# Patient Record
Sex: Male | Born: 1955
Health system: Southern US, Community
[De-identification: ages and names within clinical notes are randomized; demographics above are authoritative.]

## PROBLEM LIST (undated history)

## (undated) DIAGNOSIS — I639 Cerebral infarction, unspecified: Secondary | ICD-10-CM

## (undated) DIAGNOSIS — E785 Hyperlipidemia, unspecified: Secondary | ICD-10-CM

## (undated) HISTORY — DX: Cerebral infarction, unspecified: I63.9

---

## 2017-07-22 ENCOUNTER — Encounter (HOSPITAL_COMMUNITY): Payer: Self-pay | Admitting: Radiology

## 2017-07-22 ENCOUNTER — Observation Stay (HOSPITAL_COMMUNITY): Payer: 59

## 2017-07-22 ENCOUNTER — Emergency Department (HOSPITAL_COMMUNITY): Payer: 59

## 2017-07-22 ENCOUNTER — Inpatient Hospital Stay (HOSPITAL_COMMUNITY)
Admission: EM | Admit: 2017-07-22 | Discharge: 2017-07-26 | DRG: 041 | Disposition: A | Discharge status: Home / Self Care | Payer: 59 | Attending: Family Medicine | Admitting: Family Medicine

## 2017-07-22 DIAGNOSIS — Q2112 Patent foramen ovale: Secondary | ICD-10-CM

## 2017-07-22 DIAGNOSIS — R1310 Dysphagia, unspecified: Secondary | ICD-10-CM | POA: Diagnosis present

## 2017-07-22 DIAGNOSIS — R2990 Unspecified symptoms and signs involving the nervous system: Secondary | ICD-10-CM | POA: Diagnosis present

## 2017-07-22 DIAGNOSIS — I6340 Cerebral infarction due to embolism of unspecified cerebral artery: Secondary | ICD-10-CM | POA: Insufficient documentation

## 2017-07-22 DIAGNOSIS — Z823 Family history of stroke: Secondary | ICD-10-CM

## 2017-07-22 DIAGNOSIS — Z888 Allergy status to other drugs, medicaments and biological substances status: Secondary | ICD-10-CM

## 2017-07-22 DIAGNOSIS — G8929 Other chronic pain: Secondary | ICD-10-CM | POA: Diagnosis present

## 2017-07-22 DIAGNOSIS — I63511 Cerebral infarction due to unspecified occlusion or stenosis of right middle cerebral artery: Secondary | ICD-10-CM | POA: Diagnosis not present

## 2017-07-22 DIAGNOSIS — R001 Bradycardia, unspecified: Secondary | ICD-10-CM | POA: Diagnosis present

## 2017-07-22 DIAGNOSIS — K116 Mucocele of salivary gland: Secondary | ICD-10-CM

## 2017-07-22 DIAGNOSIS — Z8249 Family history of ischemic heart disease and other diseases of the circulatory system: Secondary | ICD-10-CM

## 2017-07-22 DIAGNOSIS — I472 Ventricular tachycardia: Secondary | ICD-10-CM | POA: Diagnosis not present

## 2017-07-22 DIAGNOSIS — K118 Other diseases of salivary glands: Secondary | ICD-10-CM

## 2017-07-22 DIAGNOSIS — E785 Hyperlipidemia, unspecified: Secondary | ICD-10-CM | POA: Diagnosis not present

## 2017-07-22 DIAGNOSIS — R471 Dysarthria and anarthria: Secondary | ICD-10-CM | POA: Diagnosis present

## 2017-07-22 DIAGNOSIS — Q211 Atrial septal defect: Secondary | ICD-10-CM

## 2017-07-22 DIAGNOSIS — I10 Essential (primary) hypertension: Secondary | ICD-10-CM | POA: Diagnosis present

## 2017-07-22 DIAGNOSIS — E214 Other specified disorders of parathyroid gland: Secondary | ICD-10-CM | POA: Diagnosis present

## 2017-07-22 DIAGNOSIS — I639 Cerebral infarction, unspecified: Secondary | ICD-10-CM | POA: Diagnosis present

## 2017-07-22 DIAGNOSIS — M25561 Pain in right knee: Secondary | ICD-10-CM | POA: Diagnosis present

## 2017-07-22 DIAGNOSIS — M1990 Unspecified osteoarthritis, unspecified site: Secondary | ICD-10-CM | POA: Diagnosis present

## 2017-07-22 DIAGNOSIS — E860 Dehydration: Secondary | ICD-10-CM | POA: Diagnosis present

## 2017-07-22 DIAGNOSIS — R29810 Facial weakness: Secondary | ICD-10-CM | POA: Diagnosis present

## 2017-07-22 DIAGNOSIS — M1991 Primary osteoarthritis, unspecified site: Secondary | ICD-10-CM | POA: Diagnosis present

## 2017-07-22 HISTORY — DX: Hyperlipidemia, unspecified: E78.5

## 2017-07-22 LAB — CBC
HEMATOCRIT: 41.8 % (ref 39.0–52.0)
HEMOGLOBIN: 14.1 g/dL (ref 13.0–17.0)
MCH: 29 pg (ref 26.0–34.0)
MCHC: 33.7 g/dL (ref 30.0–36.0)
MCV: 85.8 fL (ref 78.0–100.0)
Platelets: 197 10*3/uL (ref 150–400)
RBC: 4.87 MIL/uL (ref 4.22–5.81)
RDW: 14.3 % (ref 11.5–15.5)
WBC: 5.9 10*3/uL (ref 4.0–10.5)

## 2017-07-22 LAB — URINALYSIS, ROUTINE W REFLEX MICROSCOPIC
Bilirubin Urine: NEGATIVE
GLUCOSE, UA: NEGATIVE mg/dL
Hgb urine dipstick: NEGATIVE
KETONES UR: NEGATIVE mg/dL
Leukocytes, UA: NEGATIVE
Nitrite: NEGATIVE
PROTEIN: NEGATIVE mg/dL
Specific Gravity, Urine: 1.017 (ref 1.005–1.030)
pH: 7 (ref 5.0–8.0)

## 2017-07-22 LAB — PROTIME-INR
INR: 0.97
Prothrombin Time: 12.9 seconds (ref 11.4–15.2)

## 2017-07-22 LAB — COMPREHENSIVE METABOLIC PANEL
ALBUMIN: 3.8 g/dL (ref 3.5–5.0)
ALT: 22 U/L (ref 17–63)
AST: 22 U/L (ref 15–41)
Alkaline Phosphatase: 78 U/L (ref 38–126)
Anion gap: 8 (ref 5–15)
BUN: 19 mg/dL (ref 6–20)
CHLORIDE: 105 mmol/L (ref 101–111)
CO2: 26 mmol/L (ref 22–32)
CREATININE: 1.13 mg/dL (ref 0.61–1.24)
Calcium: 9.1 mg/dL (ref 8.9–10.3)
GFR calc Af Amer: >60 mL/min (ref >60)
GFR calc non Af Amer: >60 mL/min (ref >60)
GLUCOSE: 95 mg/dL (ref 65–99)
POTASSIUM: 4 mmol/L (ref 3.5–5.1)
Sodium: 139 mmol/L (ref 135–145)
Total Bilirubin: 1.1 mg/dL (ref 0.3–1.2)
Total Protein: 6.5 g/dL (ref 6.5–8.1)

## 2017-07-22 LAB — I-STAT CHEM 8, ED
BUN: 27 mg/dL — ABNORMAL HIGH (ref 6–20)
CHLORIDE: 104 mmol/L (ref 101–111)
CREATININE: 1 mg/dL (ref 0.61–1.24)
Calcium, Ion: 1.11 mmol/L — ABNORMAL LOW (ref 1.15–1.40)
GLUCOSE: 92 mg/dL (ref 65–99)
HEMATOCRIT: 44 % (ref 39.0–52.0)
HEMOGLOBIN: 15 g/dL (ref 13.0–17.0)
POTASSIUM: 4.3 mmol/L (ref 3.5–5.1)
Sodium: 139 mmol/L (ref 135–145)
TCO2: 28 mmol/L (ref 0–100)

## 2017-07-22 LAB — DIFFERENTIAL
BASOS ABS: 0 10*3/uL (ref 0.0–0.1)
BASOS PCT: 0 %
EOS ABS: 0.2 10*3/uL (ref 0.0–0.7)
Eosinophils Relative: 3 %
Lymphocytes Relative: 33 %
Lymphs Abs: 2 10*3/uL (ref 0.7–4.0)
MONOS PCT: 8 %
Monocytes Absolute: 0.4 10*3/uL (ref 0.1–1.0)
NEUTROS ABS: 3.3 10*3/uL (ref 1.7–7.7)
NEUTROS PCT: 56 %

## 2017-07-22 LAB — RAPID URINE DRUG SCREEN, HOSP PERFORMED
AMPHETAMINES: NOT DETECTED
BARBITURATES: NOT DETECTED
BENZODIAZEPINES: NOT DETECTED
COCAINE: NOT DETECTED
Opiates: NOT DETECTED
TETRAHYDROCANNABINOL: NOT DETECTED

## 2017-07-22 LAB — ETHANOL: Alcohol, Ethyl (B): <5 mg/dL (ref <5)

## 2017-07-22 LAB — I-STAT TROPONIN, ED: Troponin i, poc: 0 ng/mL (ref 0.00–0.08)

## 2017-07-22 LAB — CBG MONITORING, ED: Glucose-Capillary: 85 mg/dL (ref 65–99)

## 2017-07-22 LAB — APTT: APTT: 32 s (ref 24–36)

## 2017-07-22 MED ORDER — IOPAMIDOL (ISOVUE-370) INJECTION 76%
INTRAVENOUS | Status: AC
  Administered 2017-07-22: 50 mL
  Filled 2017-07-22: qty 50

## 2017-07-22 MED ORDER — CLOPIDOGREL BISULFATE 75 MG PO TABS
300.0000 mg | ORAL_TABLET | Freq: Once | ORAL | Status: AC
  Administered 2017-07-22: 300 mg via ORAL
  Filled 2017-07-22: qty 4

## 2017-07-22 MED ORDER — CLOPIDOGREL BISULFATE 75 MG PO TABS
75.0000 mg | ORAL_TABLET | Freq: Every day | ORAL | Status: DC
  Administered 2017-07-23 – 2017-07-26 (×4): 75 mg via ORAL
  Filled 2017-07-22 (×4): qty 1

## 2017-07-22 MED ORDER — LABETALOL HCL 5 MG/ML IV SOLN: 10.0000 mg | INTRAVENOUS | Status: DC | PRN

## 2017-07-22 MED ORDER — ACETAMINOPHEN 325 MG PO TABS: 650.0000 mg | ORAL_TABLET | ORAL | Status: DC | PRN

## 2017-07-22 MED ORDER — ACETAMINOPHEN 160 MG/5ML PO SOLN: 650.0000 mg | ORAL | Status: DC | PRN

## 2017-07-22 MED ORDER — ASPIRIN 300 MG RE SUPP: 300.0000 mg | Freq: Every day | RECTAL | Status: DC

## 2017-07-22 MED ORDER — SODIUM CHLORIDE 0.9 % IV SOLN: INTRAVENOUS | Status: DC

## 2017-07-22 MED ORDER — ENOXAPARIN SODIUM 40 MG/0.4ML ~~LOC~~ SOLN
40.0000 mg | SUBCUTANEOUS | Status: DC
  Administered 2017-07-22: 40 mg via SUBCUTANEOUS
  Filled 2017-07-22: qty 0.4

## 2017-07-22 MED ORDER — STROKE: EARLY STAGES OF RECOVERY BOOK
Freq: Once | Status: AC
  Administered 2017-07-22: 20:00:00

## 2017-07-22 MED ORDER — ACETAMINOPHEN 650 MG RE SUPP: 650.0000 mg | RECTAL | Status: DC | PRN

## 2017-07-22 NOTE — Evaluation (Signed)
Clinical/Bedside Swallow Evaluation Patient Details  Name: Luis [name] MRN: [id number] Date of Birth: [birth year]  Today's Date: 07/22/2017 Time: SLP Start Time (ACUTE ONLY): 1434 SLP Stop Time (ACUTE ONLY): 1500 SLP Time Calculation (min) (ACUTE ONLY): 26 min  Past Medical History: History reviewed. No pertinent past medical history. Past Surgical History: History reviewed. No pertinent surgical history. HPI:  Luis [name] an 61 y.o.malewithout any known medical conditions presented to [facility] with complaints of left facial droop, slurred speech and dysphagia. Mr. Gorr stated that he woke at 0600 and spoke to his mother without symptoms. He went back to bed and woke again at 0930 with the above symptoms. Head CT shows small ortical infarct in the posterior right frontal lobe.    Assessment / Plan / Recommendation Clinical Impression  Pt demonstrates mild dysphagia with mild left CN VII and CNXII weakness following small right frontal CVA. Pt reports coughing with liquids this am, He is very cautious and deliberate during assessment with no coughing or signs of aspiration with small sips. When pushed to take large consecutive swallows pt did have some immediate coughing and sesnation of probable aspiration. He tolerates soldis well. Recommend pt initiate a regular diet and thin liquids. Advised pt and family of signs of aspiration and to inform RN is coughing becomes more frequent and SLP can be contacted. Otherwise, SLP will f/u next week for tolerance.  SLP Visit Diagnosis: Dysphagia, oropharyngeal phase (R13.12)    Aspiration Risk  Mild aspiration risk    Diet Recommendation Regular;Thin liquid   Liquid Administration via: Cup;No straw Medication Administration: Whole meds with liquid or whole with puree if coughing with water and pills Supervision: Patient able to self feed Compensations: Slow rate;Small sips/bites Postural Changes: Seated upright at 90 degrees    Other   Recommendations Oral Care Recommendations: Oral care BID   Follow up Recommendations        Frequency and Duration min 2x/week  2 weeks       Prognosis Prognosis for Safe Diet Advancement: Good      Swallow Study   General HPI: Luis [name] an 61 y.o.malewithout any known medical conditions presented to [facility] with complaints of left facial droop, slurred speech and dysphagia. Mr. Meech stated that he woke at 0600 and spoke to his mother without symptoms. He went back to bed and woke again at 0930 with the above symptoms. Head CT shows small ortical infarct in the posterior right frontal lobe.  Type of Study: Bedside Swallow Evaluation Previous Swallow Assessment: none Diet Prior to this Study: NPO Temperature Spikes Noted: No Respiratory Status: Room air History of Recent Intubation: No Behavior/Cognition: Alert;Cooperative;Pleasant mood Oral Cavity Assessment: Within Functional Limits Oral Care Completed by SLP: No Oral Cavity - Dentition: Adequate natural dentition Vision: Functional for self-feeding Self-Feeding Abilities: Able to feed self Patient Positioning: Upright in bed Baseline Vocal Quality: Normal Volitional Cough: Strong Volitional Swallow: Able to elicit    Oral/Motor/Sensory Function Overall Oral Motor/Sensory Function: Mild impairment Facial ROM: Reduced left;Suspected CN VII (facial) dysfunction Facial Symmetry: Abnormal symmetry left;Suspected CN VII (facial) dysfunction Facial Strength: Reduced left;Suspected CN VII (facial) dysfunction Facial Sensation: Within Functional Limits Lingual ROM: Reduced left;Suspected CN XII (hypoglossal) dysfunction Lingual Symmetry: Abnormal symmetry left;Suspected CN XII (hypoglossal) dysfunction Lingual Strength: Reduced;Suspected CN XII (hypoglossal) dysfunction Lingual Sensation: Within Functional Limits Velum: Within Functional Limits Mandible: Within Functional Limits   Ice Chips     Thin Liquid Thin  Liquid: Impaired Presentation: Cup;Self Fed;Straw Pharyngeal  Phase  Impairments: Suspected delayed Swallow;Cough - Immediate    Nectar Thick Nectar Thick Liquid: Not tested   Honey Thick Honey Thick Liquid: Not tested   Puree Puree: Within functional limits Presentation: Self Fed;Spoon   Solid   GO   Solid: Within functional limits       [facility], MA CCC-SLP [phone]  Claudine Mouton 07/22/2017,3:09 PM

## 2017-07-22 NOTE — ED Notes (Signed)
This RN noted patient to be eating crackers and water when she went in to provide with a warm blanket.  Pt noted to have failed his swallow screen previously.  SLP has been ordered.  Will clarify with admitting doctor.

## 2017-07-22 NOTE — Code Documentation (Signed)
61yo male arriving to [facility] via [facility] at 1156.  Patient from home where he woke up at 0600.  He reports talking to her mother and going to the bathroom.  His speech was normal at that time, and he went back to bed.  He woke up at 0930 and family noted him to have slurred speech and facial droop.  Patient had difficulty eating his breakfast.  EMS called and activated a code stroke.  Stroke team at the bedside on patient arrival.  Labs drawn and patient to CT with stroke team.  CT and CTA head and neck completed.  NIHSS 3, see documentation for details and code stroke times.  Patient with left facial droop and mild dysarthria on exam.  Patient is outside the window for treatment with tPA.  No acute stroke treatment at this time.  Bedside handoff with ED RN Jesse Sans.

## 2017-07-22 NOTE — Evaluation (Signed)
Speech Language Pathology Evaluation Patient Details Name: Kimsey Velardo MRN: [id number] DOB: 23-Oct-1956 Today's Date: 07/22/2017 Time: 1430-1500 SLP Time Calculation (min) (ACUTE ONLY): 30 min  Problem List:  Patient Active Problem List   Diagnosis Date Noted  . Stroke-like symptoms 07/22/2017  . HLD (hyperlipidemia) 07/22/2017  . OA (osteoarthritis) 07/22/2017   Past Medical History: History reviewed. No pertinent past medical history. Past Surgical History: History reviewed. No pertinent surgical history. HPI:  Zaydenn Isgett an 61 y.o.malewithout any known medical conditions presented to [facility] with complaints of left facial droop, slurred speech and dysphagia. Mr. Beck stated that he woke at 0600 and spoke to his mother without symptoms. He went back to bed and woke again at 0930 with the above symptoms. Head CT shows small ortical infarct in the posterior right frontal lobe.    Assessment / Plan / Recommendation Clinical Impression  Pt demonstrates mild dysarthria without further cognitive deficits. Explained CN XII weakness and impact on speech. SLP provided modeling and demonstration for slow, loud, overarticulated speech to improve speech. Pt demonstrates while counting and over several trials in conversation. Will f/u to reinforce strategies, but pt will likely expericence spontaneous recovery over the next few weeks without targeted interventions given mild severity.     SLP Assessment  SLP Recommendation/Assessment: Patient needs continued Speech Lanaguage Pathology Services SLP Visit Diagnosis: Dysarthria and anarthria (R47.1)    Follow Up Recommendations  None    Frequency and Duration min 2x/week  2 weeks      SLP Evaluation Cognition  Overall Cognitive Status: Within Functional Limits for tasks assessed       Comprehension  Auditory Comprehension Overall Auditory Comprehension: Appears within functional limits for tasks assessed    Expression  Verbal Expression Overall Verbal Expression: Appears within functional limits for tasks assessed   Oral / Motor  Oral Motor/Sensory Function Overall Oral Motor/Sensory Function: Mild impairment Facial ROM: Reduced left;Suspected CN VII (facial) dysfunction Facial Symmetry: Abnormal symmetry left;Suspected CN VII (facial) dysfunction Facial Strength: Reduced left;Suspected CN VII (facial) dysfunction Facial Sensation: Within Functional Limits Lingual ROM: Reduced left;Suspected CN XII (hypoglossal) dysfunction Lingual Symmetry: Abnormal symmetry left;Suspected CN XII (hypoglossal) dysfunction Lingual Strength: Reduced;Suspected CN XII (hypoglossal) dysfunction Lingual Sensation: Within Functional Limits Velum: Within Functional Limits Mandible: Within Functional Limits Motor Speech Overall Motor Speech: Impaired Respiration: Within functional limits Phonation: Normal Resonance: Within functional limits Articulation: Impaired Level of Impairment: Word Intelligibility: Intelligible Motor Planning: Witnin functional limits Motor Speech Errors: Aware   GO                   Harlon Ditty, MA CCC-SLP [phone]  Claudine Mouton 07/22/2017, 3:15 PM

## 2017-07-22 NOTE — ED Notes (Signed)
Dinner tray ordered; regular diet 

## 2017-07-22 NOTE — Evaluation (Addendum)
Physical Therapy Evaluation Patient Details Name: Luis [name] MRN: [id number] DOB: February 03, 1956 Today's Date: 07/22/2017   History of Present Illness  Luis [name] is an 61 y.o. male without any known medical conditions presented to [facility] with complaints of left facial droop, slurred speech and dysphagia.  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     PMH:  HLD  Clinical Impression  Patient presents with problems listed below.  Will benefit from acute PT to maximize functional mobility prior to discharge home with family.  Patient with decreased balance - scored 18/24 on DGI balance assessment, where scores at or below 19/24 indicate fall risk.  Would recommend OP PT f/u at d/c for balance therapy if patient has transportation.    If not, may need to consider HHPT.    Follow Up Recommendations Outpatient PT (for Balance Training)    Equipment Recommendations  None recommended by PT    Recommendations for Other Services       Precautions / Restrictions Precautions Precautions: Fall Precaution Comments: Decreased balance with high level balance activities Restrictions Weight Bearing Restrictions: No      Mobility  Bed Mobility Overal bed mobility: Independent                Transfers Overall transfer level: Needs assistance Equipment used: None Transfers: Sit to/from Stand Sit to Stand: Supervision         General transfer comment: Supervision for safety only.  Ambulation/Gait Ambulation/Gait assistance: Min guard Ambulation Distance (Feet): 180 Feet Assistive device: None Gait Pattern/deviations: Step-through pattern;Decreased stride length Gait velocity: decreased Gait velocity interpretation: Below normal speed for age/gender General Gait Details: Patient with good gait pattern, and balance on level floor without distractions.  Stairs Stairs: Yes Stairs assistance: Min guard Stair Management: One rail Right;Step to  pattern;Forwards Number of Stairs: 3 General stair comments: Patient required use of rail and step-to pattern for safety on stairs.  Wheelchair Mobility    Modified Rankin (Stroke Patients Only) Modified Rankin (Stroke Patients Only) Pre-Morbid Rankin Score: No symptoms Modified Rankin: Moderate disability     Balance Overall balance assessment: Needs assistance Sitting-balance support: No upper extremity supported;Feet supported Sitting balance-Leahy Scale: Good     Standing balance support: No upper extremity supported Standing balance-Leahy Scale: Good Standing balance comment: Good balance on level surfaces.  Difficulty with high level balance activities.                 Standardized Balance Assessment Standardized Balance Assessment : Dynamic Gait Index   Dynamic Gait Index Level Surface: Normal Change in Gait Speed: Normal Gait with Horizontal Head Turns: Normal Gait with Vertical Head Turns: Mild Impairment Gait and Pivot Turn: Moderate Impairment Step Over Obstacle: Mild Impairment Step Around Obstacles: Normal Steps: Moderate Impairment Total Score: 18       Pertinent Vitals/Pain Pain Assessment: Faces Faces Pain Scale: Hurts a little bit Pain Location: Low back pain - chronic Pain Descriptors / Indicators: Aching;Sore Pain Intervention(s): Monitored during session;Repositioned    Home Living Family/patient expects to be discharged to:: Private residence Living Arrangements: Parent (Mom and dad) Available Help at Discharge: Family;Available 24 hours/day (Mother and sister.  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     Hand Dominance   Dominant Hand: Right    Extremity/Trunk  Assessment   Upper Extremity Assessment Upper Extremity Assessment: Defer to OT evaluation    Lower Extremity Assessment Lower Extremity Assessment: Overall WFL for tasks assessed (Fairly symmetrical strength; decreased coordination)    Cervical / Trunk Assessment Cervical / Trunk Assessment: Normal  Communication   Communication: Expressive difficulties  Cognition Arousal/Alertness: Awake/alert Behavior During Therapy: WFL for tasks assessed/performed Overall Cognitive Status: Within Functional Limits for tasks assessed                                        General Comments      Exercises     Assessment/Plan    PT Assessment Patient needs continued PT services  PT Problem List Decreased balance;Decreased mobility;Decreased coordination;Decreased knowledge of use of DME;Pain       PT Treatment Interventions DME instruction;Gait training;Stair training;Functional mobility training;Therapeutic activities;Therapeutic exercise;Balance training;Neuromuscular re-education;Patient/family education    PT Goals (Current goals can be found in the Care Plan section)  Acute Rehab PT Goals Patient Stated Goal: To get back to normal PT Goal Formulation: With patient Time For Goal Achievement: 07/29/17 Potential to Achieve Goals: Good Additional Goals Additional Goal #1: Patient will score > 21/24 on DGI balance assessment to indicate decrease in fall risk.    Frequency Min 4X/week   Barriers to discharge        Co-evaluation               AM-PAC PT "6 Clicks" Daily Activity  Outcome Measure Difficulty turning over in bed (including adjusting bedclothes, sheets and blankets)?: None Difficulty moving from lying on back to sitting on the side of the bed? : None Difficulty sitting down on and standing up from a chair with arms (e.g., wheelchair, bedside commode, etc,.)?: None Help needed moving to and from a bed to chair (including a wheelchair)?: A  Little Help needed walking in hospital room?: A Little Help needed climbing 3-5 steps with a railing? : A Little 6 Click Score: 21    End of Session Equipment Utilized During Treatment: Gait belt Activity Tolerance: Patient tolerated treatment well Patient left: in bed;with call bell/phone within reach;with nursing/sitter in room Nurse Communication: Mobility status PT Visit Diagnosis: Unsteadiness on feet (R26.81);Other abnormalities of gait and mobility (R26.89)    Time: 2019-2044 PT Time Calculation (min) (ACUTE ONLY): 25 min   Charges:   PT Evaluation $PT Eval Moderate Complexity: 1 Mod PT Treatments $Gait Training: 8-22 mins   PT G Codes:   PT G-Codes **NOT FOR INPATIENT CLASS** Functional Assessment Tool Used: AM-PAC 6 Clicks Basic Mobility Functional Limitation: Mobility: Walking and moving around Mobility: Walking and Moving Around Current Status (U0233): At least 20 percent but less than 40 percent impaired, limited or restricted Mobility: Walking and Moving Around Goal Status [id number]): At least 1 percent but less than 20 percent impaired, limited or restricted    Durenda Hurt. Renaldo Fiddler, [facility] Acute Rehab Services Pager [phone]   Vena Austria 07/22/2017, 9:28 PM

## 2017-07-22 NOTE — H&P (Signed)
History and Physical    Luis [name] YPP:[id number] DOB: July 30, 1956 DOA: 07/22/2017   PCP: Patient, No Pcp Per   Attending physician: Melynda Ripple  Patient coming from/Resides with: Private residence/  Chief Complaint: Slurred speech and left facial drooping  HPI: Luis [name] is a 61 y.o. male with medical history significant for dyslipidemia for which he stopped taking medications due to reports of knee pain and family history of CVA and MI. Patient reports that he initially awakened around 6:00 this morning and was in his usual state of health. Went back to sleep and got up at 9:30 AM to go eat breakfast. While eating breakfast he noticed he was having difficulty drinking his coffee and swallowing his foods. His wife noted left side facial drooping and he was aware that he was having difficulty speaking and that he "sounded drunk". He presented to the ER but due to mild symptomatology was not a candidate for TPA although code stroke was initiated and he was evaluated by neurology. Initial CT of the head demonstrated a possible small cortical infarct in the posterior right frontal lobe without acute hemorrhage. CTA head and neck were negative and no large vessel issues determined. He was also an incidental finding of an 11 mm sclerotic focus in the T3 body consistent with a bone island in the absence of a malignancy history.  ED Course:  Vital Signs: BP 125/80 (BP Location: Right Arm)   Pulse (!) 53   Temp 98.3 F (36.8 C) (Oral)   Resp 16   SpO2 99%  CT head without contrast: As above CTA head and neck: As above Lab data: Sodium 139, potassium 4.0, chloride 105, CO2 26, glucose 95, BUN 19, creatinine 1.13, anion gap 8, LFTs normal, poc troponin negative, white count 5900 and normal differential, hemoglobin 14.1, platelets 197,000, PT 12.9, INR 0.97, PTT 32, alcohol <5 Medications and treatments: None  Review of Systems:  In addition to the HPI above,  No Fever-chills, myalgias or  other constitutional symptoms No Headache, changes with Vision or hearing, new weakness, tingling, numbness in any extremity, dizziness,  gait disturbance or imbalance, tremors or seizure activity No indigestion/reflux, abdominal pain with or after eating No Chest pain, Cough or Shortness of Breath, palpitations, orthopnea or DOE No Abdominal pain, N/V, melena,hematochezia, dark tarry stools, constipation No dysuria, malodorous urine, hematuria or flank pain No new skin rashes, lesions, masses or bruises, No new joint pains, aches, swelling or redness No recent unintentional weight gain or loss No polyuria, polydypsia or polyphagia   History reviewed. No pertinent past medical history.  History reviewed. No pertinent surgical history.  Social History   Social History  . Marital status: Married    Spouse name: N/A  . Number of children: N/A  . Years of education: N/A   Occupational History  . Not on file.   Social History Main Topics  . Smoking status: Not on file  . Smokeless tobacco: Not on file  . Alcohol use Not on file  . Drug use: Unknown  . Sexual activity: Not on file   Other Topics Concern  . Not on file   Social History Narrative  . No narrative on file    Mobility: Independent Work history: Not obtained   Allergies  Allergen Reactions  . Flexeril [Cyclobenzaprine] Nausea Only    Family history reviewed:Positive for grandmother and uncle with CVA and father with MI, also history of diabetes within the family  Prior to Admission medications   Medication  Sig Start Date End Date Taking? Authorizing Provider  Ascorbic Acid (VITAMIN C PO) Take 1 tablet by mouth daily.   Yes [provider]  B Complex Vitamins (B COMPLEX 1 PO) Take 1 tablet by mouth daily.   Yes [provider]  ibuprofen (ADVIL,MOTRIN) 200 MG tablet Take 400 mg by mouth every 6 (six) hours as needed for headache or mild pain.   Yes [provider]  LUTEIN PO Take  1 tablet by mouth daily.   Yes [provider]  Multiple Vitamin (MULTIVITAMIN WITH MINERALS) TABS tablet Take 1 tablet by mouth daily.   Yes [provider]  naproxen sodium (ANAPROX) 220 MG tablet Take 220 mg by mouth as needed (Pain and Headace).   Yes [provider]  VITAMIN E PO Take 1 capsule by mouth daily.   Yes [provider]    Physical Exam: Vitals:   07/22/17 1230 07/22/17 1231  BP: 125/80 125/80  Pulse: (!) 51 (!) 53  Resp: 18 16  Temp:  98.3 F (36.8 C)  TempSrc:  Oral  SpO2: 99% 99%      Constitutional: NAD, calm, comfortable Eyes: PERRL, lids and conjunctivae normal with slight upper left lid drooping ENMT: Mucous membranes are moist. Posterior pharynx clear of any exudate or lesions. Age-appropriate dentition.  Neck: normal, supple, no masses, no thyromegaly Respiratory: clear to auscultation bilaterally, no wheezing, no crackles. Normal respiratory effort. No accessory muscle use.  Cardiovascular: Regular rate and rhythm, no murmurs / rubs / gallops. No extremity edema. 2+ pedal pulses. No carotid bruits.  Abdomen: no tenderness, no masses palpated. No hepatosplenomegaly. Bowel sounds positive.  Musculoskeletal: no clubbing / cyanosis. No joint deformity upper and lower extremities. Good ROM, no contractures. Normal muscle tone.  Skin: no rashes, lesions, ulcers. No induration Neurologic: CN 2-12 grossly intact except for barely perceptible left upper lid drooping as well as left facial drooping. Sensation intact, DTR normal. Strength 5/5 x all 4 extremities.  Psychiatric: Normal judgment and insight. Alert and oriented x 3. Normal mood.    Labs on Admission: I have personally reviewed following labs and imaging studies  CBC:  Recent Labs Lab 07/22/17 1200 07/22/17 1207  WBC 5.9  --   NEUTROABS 3.3  --   HGB 14.1 15.0  HCT 41.8 44.0  MCV 85.8  --   PLT 197  --    Basic Metabolic Panel:  Recent Labs Lab  07/22/17 1200 07/22/17 1207  NA 139 139  K 4.0 4.3  CL 105 104  CO2 26  --   GLUCOSE 95 92  BUN 19 27*  CREATININE 1.13 1.00  CALCIUM 9.1  --    GFR: CrCl cannot be calculated (Unknown ideal weight.). Liver Function Tests:  Recent Labs Lab 07/22/17 1200  AST 22  ALT 22  ALKPHOS 78  BILITOT 1.1  PROT 6.5  ALBUMIN 3.8   No results for input(s): LIPASE, AMYLASE in the last 168 hours. No results for input(s): AMMONIA in the last 168 hours. Coagulation Profile:  Recent Labs Lab 07/22/17 1200  INR 0.97   Cardiac Enzymes: No results for input(s): CKTOTAL, CKMB, CKMBINDEX, TROPONINI in the last 168 hours. BNP (last 3 results) No results for input(s): PROBNP in the last 8760 hours. HbA1C: No results for input(s): HGBA1C in the last 72 hours. CBG:  Recent Labs Lab 07/22/17 1159  GLUCAP 85   Lipid Profile: No results for input(s): CHOL, HDL, LDLCALC, TRIG, CHOLHDL, LDLDIRECT in the last  72 hours. Thyroid Function Tests: No results for input(s): TSH, T4TOTAL, FREET4, T3FREE, THYROIDAB in the last 72 hours. Anemia Panel: No results for input(s): VITAMINB12, FOLATE, FERRITIN, TIBC, IRON, RETICCTPCT in the last 72 hours. Urine analysis: No results found for: COLORURINE, APPEARANCEUR, LABSPEC, PHURINE, GLUCOSEU, HGBUR, BILIRUBINUR, KETONESUR, PROTEINUR, UROBILINOGEN, NITRITE, LEUKOCYTESUR Sepsis Labs: @LABRCNTIP (procalcitonin:4,lacticidven:4) )No results found for this or any previous visit (from the past 240 hour(s)).   Radiological Exams on Admission: Ct Angio Head W Or Wo Contrast  Result Date: 07/22/2017 CLINICAL DATA:  Left facial droop and slurred speech EXAM: CT ANGIOGRAPHY HEAD AND NECK TECHNIQUE: Multidetector CT imaging of the head and neck was performed using the standard protocol during bolus administration of intravenous contrast. Multiplanar CT image reconstructions and MIPs were obtained to evaluate the vascular anatomy. Carotid stenosis measurements  (when applicable) are obtained utilizing NASCET criteria, using the distal internal carotid diameter as the denominator. CONTRAST:  50 cc Isovue 370 intravenous COMPARISON:  Noncontrast head CT from earlier the same day FINDINGS: CTA NECK FINDINGS Aortic arch: Normal appearance.  Three vessel branching. Right carotid system: Vessels are smooth and widely patent. No noted atheromatous changes. Left carotid system: Vessels are smooth and widely patent. No noted atheromatous changes. Vertebral arteries: No proximal subclavian stenosis. Vertebral arteries are smooth and widely patent. Dominant left vertebral artery. Skeleton: No acute or aggressive finding cervical disc degeneration. There is an 11 mm sclerotic focus in the right aspect of the L3 body, 11 mm in diameter. Other neck: No noted mass or inflammation. Upper chest: Negative. Review of the MIP images confirms the above findings CTA HEAD FINDINGS Anterior circulation: No branch occlusion, stenosis, or aneurysm. Negative for beading Posterior circulation: Left dominant vertebral artery. Standard vertebrobasilar branching. No branch occlusion or flow limiting stenosis. Negative for beading or aneurysm. Venous sinuses: Widely patent. Anatomic variants: None significant Delayed phase: Not performed in the emergent setting Review of the MIP images confirms the above findings IMPRESSION: Negative CTA of the head neck. No emergent large vessel occlusion, proximal stenosis, or atheromatous changes. 11 mm sclerotic focus in the T3 body is consistent with a bone island in the absence of malignancy history. Electronically Signed   By: Marnee Spring M.D.   On: 07/22/2017 12:42   Ct Angio Neck W Or Wo Contrast  Result Date: 07/22/2017 CLINICAL DATA:  Left facial droop and slurred speech EXAM: CT ANGIOGRAPHY HEAD AND NECK TECHNIQUE: Multidetector CT imaging of the head and neck was performed using the standard protocol during bolus administration of intravenous  contrast. Multiplanar CT image reconstructions and MIPs were obtained to evaluate the vascular anatomy. Carotid stenosis measurements (when applicable) are obtained utilizing NASCET criteria, using the distal internal carotid diameter as the denominator. CONTRAST:  50 cc Isovue 370 intravenous COMPARISON:  Noncontrast head CT from earlier the same day FINDINGS: CTA NECK FINDINGS Aortic arch: Normal appearance.  Three vessel branching. Right carotid system: Vessels are smooth and widely patent. No noted atheromatous changes. Left carotid system: Vessels are smooth and widely patent. No noted atheromatous changes. Vertebral arteries: No proximal subclavian stenosis. Vertebral arteries are smooth and widely patent. Dominant left vertebral artery. Skeleton: No acute or aggressive finding cervical disc degeneration. There is an 11 mm sclerotic focus in the right aspect of the L3 body, 11 mm in diameter. Other neck: No noted mass or inflammation. Upper chest: Negative. Review of the MIP images confirms the above findings CTA HEAD FINDINGS Anterior circulation: No branch occlusion, stenosis, or aneurysm. Negative  for beading Posterior circulation: Left dominant vertebral artery. Standard vertebrobasilar branching. No branch occlusion or flow limiting stenosis. Negative for beading or aneurysm. Venous sinuses: Widely patent. Anatomic variants: None significant Delayed phase: Not performed in the emergent setting Review of the MIP images confirms the above findings IMPRESSION: Negative CTA of the head neck. No emergent large vessel occlusion, proximal stenosis, or atheromatous changes. 11 mm sclerotic focus in the T3 body is consistent with a bone island in the absence of malignancy history. Electronically Signed   By: Marnee Spring M.D.   On: 07/22/2017 12:42   Ct Head Code Stroke Wo Contrast  Result Date: 07/22/2017 CLINICAL DATA:  Code stroke.  Left facial droop and slurred speech. EXAM: CT HEAD WITHOUT CONTRAST  TECHNIQUE: Contiguous axial images were obtained from the base of the skull through the vertex without intravenous contrast. COMPARISON:  None. FINDINGS: Brain: Subtle area of gray-white blurring in the lateral and posterior right frontal lobe, also seen on sagittal reformats. No hemorrhage, hydrocephalus, or atrophy. Vascular: No hyperdense vessel. Minimal atherosclerotic calcification Skull: No acute or aggressive finding. Sinuses/Orbits: Negative Other: These results were called by telephone at the time of interpretation on 07/22/2017 at 12:09 pm to [facility] who reports a CT angio is pending. ASPECTS [facility] Stroke Program Early CT Score) - Ganglionic level infarction (caudate, lentiform nuclei, internal capsule, insula, M1-M3 cortex): 7 - Supraganglionic infarction (M4-M6 cortex): 2 Total score (0-10 with 10 being normal): 9 IMPRESSION: 1. Possible small cortical infarct in the posterior right frontal lobe. ASPECTS is 9. 2. No acute hemorrhage. Electronically Signed   By: Marnee Spring M.D.   On: 07/22/2017 12:13    EKG: (Independently reviewed) sinus rhythm with bradycardic rate 55 bpm, QTC 4 and 49 ms, no acute ischemic changes, normal R-wave rotation  Assessment/Plan Principal Problem:   Stroke-like symptoms -Presents with abrupt onset of left facial drooping and dysarthria as well as mild difficulty managing oral secretions with CT showing possible acute infarct -Neurology consulted -Failed bedside swallow eval-SLP evaluation: Recommendation is for regular diet with thin liquids -PT/OT evaluation -Frequent neurological checks -MR brain -Carotid duplex although may be able to cancel since did undergo CTA head and neck -HgbA1c and lipid panel -Aspirin  Active Problems:   HLD (hyperlipidemia) -States was on simvastatin in the past but quit taking due to knee pain he presumed was related to statin -Follow up on lipid panel    OA (osteoarthritis) -Chronic right knee pain that limits  mobility but is never been formally diagnosed with osteoarthritis      DVT prophylaxis: Lovenox  Code Status: Full  Family Communication: Wife and daughter  Disposition Plan: Home Consults called: Neuro hospitalist    Russella Dar ANP-BC [facility] Pager [phone]   If 7PM-7AM, please contact night-coverage [email or web address] Password [email or web address]  07/22/2017, 2:06 PM

## 2017-07-22 NOTE — ED Provider Notes (Signed)
[facility]-EMERGENCY DEPT Provider Note   CSN: [id number] Arrival date & time: 07/22/17  1156     History   Chief Complaint Chief Complaint  Patient presents with  . Code Stroke    HPI Daevion Navarette is a 61 y.o. male.  HPI 61 year old gentleman with a history of ulcer arthritis and hyperlipidemia presents to the emergency department as a code stroke. Last known normal was approximately 6:00 this morning. Patient awoke from his sleep at 6 and was noted to be normal, went back to sleep and woke up at 9:30 at which point the patient felt "off." The patient's wife noted left facial droop and noted that he was having difficulty speaking. EMS was called and patient was made a code stroke in route. Upon arrival patient did have left-sided facial droop and had dysarthria.   He denied any physical complaints other than the above. He denied any associated chest pain, shortness of breath, nausea, vomiting, headache, vision changes. Denies any recent fevers or infections.  No other alleviating or aggravating factors.  History reviewed. No pertinent past medical history.  Patient Active Problem List   Diagnosis Date Noted      . HLD (hyperlipidemia) 07/22/2017  . OA (osteoarthritis) 07/22/2017    History reviewed. No pertinent surgical history.     Home Medications    Prior to Admission medications   Medication Sig Start Date End Date Taking? Authorizing Provider  Ascorbic Acid (VITAMIN C PO) Take 1 tablet by mouth daily.   Yes [provider]  B Complex Vitamins (B COMPLEX 1 PO) Take 1 tablet by mouth daily.   Yes [provider]  ibuprofen (ADVIL,MOTRIN) 200 MG tablet Take 400 mg by mouth every 6 (six) hours as needed for headache or mild pain.   Yes [provider]  LUTEIN PO Take 1 tablet by mouth daily.   Yes [provider]  Multiple Vitamin (MULTIVITAMIN WITH MINERALS) TABS tablet Take 1 tablet by mouth daily.   Yes [provider]    naproxen sodium (ANAPROX) 220 MG tablet Take 220 mg by mouth as needed (Pain and Headace).   Yes [provider]  VITAMIN E PO Take 1 capsule by mouth daily.   Yes [provider]    Family History No family history on file.  Social History Social History  Substance Use Topics  . Smoking status: Not on file  . Smokeless tobacco: Not on file  . Alcohol use Not on file     Allergies   Flexeril [cyclobenzaprine]   Review of Systems Review of Systems All other systems are reviewed and are negative for acute change except as noted in the HPI   Physical Exam Updated Vital Signs BP 109/75   Pulse (!) 44   Temp 98.3 F (36.8 C) (Oral)   Resp 19   SpO2 99%   Physical Exam  Constitutional: He is oriented to person, place, and time. He appears well-developed and well-nourished. No distress.  HENT:  Head: Normocephalic and atraumatic.  Nose: Nose normal.  Eyes: Pupils are equal, round, and reactive to light. Conjunctivae and EOM are normal. Right eye exhibits no discharge. Left eye exhibits no discharge. No scleral icterus.  Neck: Normal range of motion. Neck supple.  Cardiovascular: Normal rate and regular rhythm.  Exam reveals no gallop and no friction rub.   No murmur heard. Pulmonary/Chest: Effort normal and breath sounds normal. No stridor. No respiratory distress. He has no rales.  Abdominal: Soft. He exhibits  no distension. There is no tenderness.  Musculoskeletal: He exhibits no edema or tenderness.  Neurological: He is alert and oriented to person, place, and time.  Mental Status: Alert and oriented to person, place, and time. Attention and concentration normal. Slurred speech, dysarthric. Recent memory is intact  Cranial Nerves  II Visual Fields: Intact to confrontation. Visual fields intact. III, IV, VI: Pupils equal and reactive to light and near. Full eye movement without nystagmus  V Facial Sensation: Normal. No weakness of masticatory muscles   VII: Left facial droop with no forehead involvement  VIII Auditory Acuity: Grossly normal  IX/X: The uvula is midline; the palate elevates symmetrically  XI: Normal sternocleidomastoid and trapezius strength  XII: The tongue is midline. No atrophy or fasciculations.   Motor System: Muscle Strength: 5/5 and symmetric in the upper and lower extremities. No pronation or drift.  Muscle Tone: Tone and muscle bulk are normal in the upper and lower extremities.   Reflexes: DTRs: 1+ and symmetrical in all four extremities. Plantar responses are flexor bilaterally.  Coordination: Intact finger-to-nose. No tremor.  Sensation: Intact to light touch, and pinprick.  Gait: Deferred   Skin: Skin is warm and dry. No rash noted. He is not diaphoretic. No erythema.  Psychiatric: He has a normal mood and affect.  Vitals reviewed.    ED Treatments / Results  Labs (all labs ordered are listed, but only abnormal results are displayed) Labs Reviewed  URINALYSIS, ROUTINE W REFLEX MICROSCOPIC - Abnormal; Notable for the following:       Result Value   Color, Urine STRAW (*)    All other components within normal limits  I-STAT CHEM 8, ED - Abnormal; Notable for the following:    BUN 27 (*)    Calcium, Ion 1.11 (*)    All other components within normal limits  ETHANOL  PROTIME-INR  APTT  CBC  DIFFERENTIAL  COMPREHENSIVE METABOLIC PANEL  RAPID URINE DRUG SCREEN, HOSP PERFORMED  I-STAT TROPONIN, ED  CBG MONITORING, ED    EKG  EKG Interpretation None       Radiology Ct Angio Head W Or Wo Contrast  Result Date: 07/22/2017 CLINICAL DATA:  Left facial droop and slurred speech EXAM: CT ANGIOGRAPHY HEAD AND NECK TECHNIQUE: Multidetector CT imaging of the head and neck was performed using the standard protocol during bolus administration of intravenous contrast. Multiplanar CT image reconstructions and MIPs were obtained to evaluate the vascular anatomy. Carotid stenosis measurements (when  applicable) are obtained utilizing NASCET criteria, using the distal internal carotid diameter as the denominator. CONTRAST:  50 cc Isovue 370 intravenous COMPARISON:  Noncontrast head CT from earlier the same day FINDINGS: CTA NECK FINDINGS Aortic arch: Normal appearance.  Three vessel branching. Right carotid system: Vessels are smooth and widely patent. No noted atheromatous changes. Left carotid system: Vessels are smooth and widely patent. No noted atheromatous changes. Vertebral arteries: No proximal subclavian stenosis. Vertebral arteries are smooth and widely patent. Dominant left vertebral artery. Skeleton: No acute or aggressive finding cervical disc degeneration. There is an 11 mm sclerotic focus in the right aspect of the L3 body, 11 mm in diameter. Other neck: No noted mass or inflammation. Upper chest: Negative. Review of the MIP images confirms the above findings CTA HEAD FINDINGS Anterior circulation: No branch occlusion, stenosis, or aneurysm. Negative for beading Posterior circulation: Left dominant vertebral artery. Standard vertebrobasilar branching. No branch occlusion or flow limiting stenosis. Negative for beading or aneurysm. Venous sinuses: Widely patent. Anatomic variants:  None significant Delayed phase: Not performed in the emergent setting Review of the MIP images confirms the above findings IMPRESSION: Negative CTA of the head neck. No emergent large vessel occlusion, proximal stenosis, or atheromatous changes. 11 mm sclerotic focus in the T3 body is consistent with a bone island in the absence of malignancy history. Electronically Signed   By: Marnee Spring M.D.   On: 07/22/2017 12:42   Ct Angio Neck W Or Wo Contrast  Result Date: 07/22/2017 CLINICAL DATA:  Left facial droop and slurred speech EXAM: CT ANGIOGRAPHY HEAD AND NECK TECHNIQUE: Multidetector CT imaging of the head and neck was performed using the standard protocol during bolus administration of intravenous contrast.  Multiplanar CT image reconstructions and MIPs were obtained to evaluate the vascular anatomy. Carotid stenosis measurements (when applicable) are obtained utilizing NASCET criteria, using the distal internal carotid diameter as the denominator. CONTRAST:  50 cc Isovue 370 intravenous COMPARISON:  Noncontrast head CT from earlier the same day FINDINGS: CTA NECK FINDINGS Aortic arch: Normal appearance.  Three vessel branching. Right carotid system: Vessels are smooth and widely patent. No noted atheromatous changes. Left carotid system: Vessels are smooth and widely patent. No noted atheromatous changes. Vertebral arteries: No proximal subclavian stenosis. Vertebral arteries are smooth and widely patent. Dominant left vertebral artery. Skeleton: No acute or aggressive finding cervical disc degeneration. There is an 11 mm sclerotic focus in the right aspect of the L3 body, 11 mm in diameter. Other neck: No noted mass or inflammation. Upper chest: Negative. Review of the MIP images confirms the above findings CTA HEAD FINDINGS Anterior circulation: No branch occlusion, stenosis, or aneurysm. Negative for beading Posterior circulation: Left dominant vertebral artery. Standard vertebrobasilar branching. No branch occlusion or flow limiting stenosis. Negative for beading or aneurysm. Venous sinuses: Widely patent. Anatomic variants: None significant Delayed phase: Not performed in the emergent setting Review of the MIP images confirms the above findings IMPRESSION: Negative CTA of the head neck. No emergent large vessel occlusion, proximal stenosis, or atheromatous changes. 11 mm sclerotic focus in the T3 body is consistent with a bone island in the absence of malignancy history. Electronically Signed   By: Marnee Spring M.D.   On: 07/22/2017 12:42   Mr Brain Wo Contrast  Result Date: 07/22/2017 CLINICAL DATA:  61 y/o  M; slurred speech and left facial drooping. EXAM: MRI HEAD WITHOUT CONTRAST TECHNIQUE: Multiplanar,  multiecho pulse sequences of the brain and surrounding structures were obtained without intravenous contrast. COMPARISON:  07/22/2017 CT angiogram of the head and CT of the head. FINDINGS: Brain: Right posterolateral frontal lobe reduced diffusion measuring 1.5 x 1.5 x 3.1 cm (volume = 3.7 cm^3) (AP x ML x CC series 3, image 31 and series 4, image 19) compatible with acute/ early subacute infarction. There are a few additional punctate foci of reduced diffusion in the right anterolateral frontal lobe. The infarct demonstrates mild T2 FLAIR hyperintense signal abnormality. No significant mass effect. No evidence for intracranial hemorrhage, hydrocephalus, or herniation. Vascular: Normal flow voids. Skull and upper cervical spine: Normal marrow signal. Sinuses/Orbits: Negative. Other: Well-circumscribed 8 mm T2 hyperintense, incomplete FLAIR suppression, T1 hypointense focus within superior aspect of the superficial lobe of left parotid gland with soft tissue attenuation on prior CT angiogram (series 6, image 2). IMPRESSION: 1. Right posterolateral frontal lobe acute/early subacute infarction, volume 3.7 cc. No acute hemorrhage. 2. Otherwise unremarkable MRI of the brain. 3. 8 mm lesion within the superior aspect of superficial lobe of left  parotid gland with soft tissue attenuation on prior CT angiogram. Differential includes salivary neoplasm and proteinaceous cyst, unlikely lymph node given morphology. This area is likely amenable to ultrasound evaluation to further characterize. Electronically Signed   By: Mitzi Hansen M.D.   On: 07/22/2017 17:05   Ct Head Code Stroke Wo Contrast  Result Date: 07/22/2017 CLINICAL DATA:  Code stroke.  Left facial droop and slurred speech. EXAM: CT HEAD WITHOUT CONTRAST TECHNIQUE: Contiguous axial images were obtained from the base of the skull through the vertex without intravenous contrast. COMPARISON:  None. FINDINGS: Brain: Subtle area of gray-white blurring in  the lateral and posterior right frontal lobe, also seen on sagittal reformats. No hemorrhage, hydrocephalus, or atrophy. Vascular: No hyperdense vessel. Minimal atherosclerotic calcification Skull: No acute or aggressive finding. Sinuses/Orbits: Negative Other: These results were called by telephone at the time of interpretation on 07/22/2017 at 12:09 pm to PA [facility] who reports a CT angio is pending. ASPECTS [facility] Stroke Program Early CT Score) - Ganglionic level infarction (caudate, lentiform nuclei, internal capsule, insula, M1-M3 cortex): 7 - Supraganglionic infarction (M4-M6 cortex): 2 Total score (0-10 with 10 being normal): 9 IMPRESSION: 1. Possible small cortical infarct in the posterior right frontal lobe. ASPECTS is 9. 2. No acute hemorrhage. Electronically Signed   By: Marnee Spring M.D.   On: 07/22/2017 12:13    Procedures Procedures (including critical care time)  Medications Ordered in ED Medications  aspirin suppository 300 mg (not administered)  0.9 %  sodium chloride infusion (not administered)  iopamidol (ISOVUE-370) 76 % injection (50 mLs  Contrast Given 07/22/17 1209)     Initial Impression / Assessment and Plan / ED Course  I have reviewed the triage vital signs and the nursing notes.  Pertinent labs & imaging results that were available during my care of the patient were reviewed by me and considered in my medical decision making (see chart for details).     Code stroke. CT head without acute findings. Not a candidate for TPA at this time. Labs grossly reassuring without electrolyte derangements.   Patient admitted to medicine for stroke workup and will be followed by an  Final Clinical Impressions(s) / ED Diagnoses   Final diagnoses:  Stroke-like symptoms     Larra Crunkleton, Amadeo Garnet, MD 07/22/17 1729

## 2017-07-22 NOTE — ED Notes (Signed)
Speech at bedside to do swallow screen

## 2017-07-22 NOTE — ED Notes (Signed)
SLP results in chart at this time, regular diet recommended.  Will order tray.

## 2017-07-22 NOTE — ED Triage Notes (Signed)
Patient presents to the ED from home. Per patient he woke up at 0600 and was fine went back to sleep and at 0930 he was eating and started having difficulty eating. Patient has left sided facial droop and Dysarthria. Patient alert and oriented x4. Patient denies any medical history.

## 2017-07-22 NOTE — Consult Note (Addendum)
Requesting Physician: Dr. Eudelia Bunch     Chief Complaint: Slurred speech, left facial droop, dysphagia  History obtained from:  Patient   HPI:                                                                                                                                      Luis [name] is an 61 y.o. male without any known medical conditions presented to [facility] with complaints of left facial droop, slurred speech and dysphagia. Luis [name] stated that he woke at 0600 and spoke to his mother without symptoms. He went back to bed and woke again at 0930 with the above symptoms. He presented to [facility] for evaluation and treatment.  Pertinent Medications: CT head 07/22/17: Possible small cortical infarct in the posterior right frontal lobe CTA head/neck 07/22/17: Negative CTA of the head neck  Date last known well: Date: 07/22/2017 Time last known well: Time: 06:00  tPA Given: No: Not LVO, minimal symptoms  Modified Rankin Score: 0  Stroke Risk Factors - none   History reviewed. No pertinent past medical history.  History reviewed. No pertinent surgical history.  No family history on file.   has no tobacco, alcohol, and drug history on file.  No Known Allergies  Medications:                                                                                                                       No outpatient prescriptions have been marked as taking for the 07/22/17 encounter [facility] Encounter).    Review Of Systems:                                                                                                           History obtained from the patient  General: Positive for chills - he states that he is very cold in the ED Psychological: Negative for any known behavioral disorder Ophthalmic: Negative for blurry or double vision, loss of vision in any  field. Had Lasix surgery bilaterally ENT: Negative for tinnitus or vertigo Allergy: Wasp sting on finger at 2330 last  night Respiratory: Negative for cough, shortness of breath Cardiovascular: Negative for chest pain, dyspnea on exertion, edema or irregular heartbeat Gastrointestinal: Negative for abdominal pain Musculoskeletal: Negative for joint swelling or muscular weakness Neurological: As noted in HPI Dermatological: Negative for rash or skin changes, numbness or tingling  Blood pressure 125/80, pulse (!) 53, temperature 98.3 F (36.8 C), temperature source Oral, resp. rate 16, SpO2 99 %.   Physical Examination:                                                                                                      General: WDWN male.  HEENT:  Normocephalic, no lesions, without obvious abnormality.  Normal external eye and conjunctiva.  Normal external ears. Normal external nose, mucus membranes and septum.  Normal pharynx. Cardiovascular: regular rate and rhythm, pulses palpable throughout   Pulmonary: CTA bilaterally Abdomen: Soft, non tender Extremities: no joint deformities, effusion, or inflammation. Wasp sting reported on a pointer finger (doesn't remember which one, no notable mark) Musculoskeletal: no joint tenderness, deformity or swelling. Tone and bulk normal throughout; no atrophy noted Skin: warm and dry, no hyperpigmentation, vitiligo, or suspicious lesions  Neurological Examination:                                                                                               Mental Status: Luis [name] is alert, oriented, thought content appropriate.  Speech dysarthric without evidence of aphasia. Able to follow 3-step commands without difficulty. Cranial Nerves: II: Visual fields grossly normal, pupils are equal, round, sluggishly reactive to light  III,IV, VI: Ptosis not present, extra-ocular muscle movements intact bilaterally V,VII: Smile drooped to flat on the left. Eyebrow raise is symmetric. Facial light touch sensation intact bilaterally VIII: Hearing grossly intact IX,X:  Uvula and palate rise symmetrically XI: SCM and bilateral shoulder shrug strength 5/5 XII: Midline tongue extension Motor: Right :     Upper extremity   5/5   Left:     Upper extremity   5/5          Lower extremity   5/5    Lower extremity   5/5 Pronator drift not present Sensory: Pinprick and light touch intact throughout, bilaterally Deep Tendon Reflexes: 2+ and symmetric throughout Plantars: Right: downgoing   Left: downgoing Cerebellar: Finger-to-nose test without evidence of dysmetria or ataxia. Heel-to-shin test executed within normal limits. Gait: Deferred  Lab Results: Basic Metabolic Panel:  Recent Labs Lab 07/22/17 1207  NA 139  K 4.3  CL 104  GLUCOSE 92  BUN 27*  CREATININE 1.00    Liver  Function Tests: No results for input(s): AST, ALT, ALKPHOS, BILITOT, PROT, ALBUMIN in the last 168 hours. No results for input(s): LIPASE, AMYLASE in the last 168 hours. No results for input(s): AMMONIA in the last 168 hours.  CBC:  Recent Labs Lab 07/22/17 1200 07/22/17 1207  WBC 5.9  --   NEUTROABS 3.3  --   HGB 14.1 15.0  HCT 41.8 44.0  MCV 85.8  --   PLT 197  --     Cardiac Enzymes: No results for input(s): CKTOTAL, CKMB, CKMBINDEX, TROPONINI in the last 168 hours.  Lipid Panel: No results for input(s): CHOL, TRIG, HDL, CHOLHDL, VLDL, LDLCALC in the last 168 hours.  CBG:  Recent Labs Lab 07/22/17 1159  GLUCAP 85    Microbiology: No results found for this or any previous visit.  Coagulation Studies:  Recent Labs  07/22/17 1200  LABPROT 12.9  INR 0.97    Imaging: Ct Head Code Stroke Wo Contrast  Result Date: 07/22/2017 CLINICAL DATA:  Code stroke.  Left facial droop and slurred speech. EXAM: CT HEAD WITHOUT CONTRAST TECHNIQUE: Contiguous axial images were obtained from the base of the skull through the vertex without intravenous contrast. COMPARISON:  None. FINDINGS: Brain: Subtle area of gray-white blurring in the lateral and posterior right  frontal lobe, also seen on sagittal reformats. No hemorrhage, hydrocephalus, or atrophy. Vascular: No hyperdense vessel. Minimal atherosclerotic calcification Skull: No acute or aggressive finding. Sinuses/Orbits: Negative Other: These results were called by telephone at the time of interpretation on 07/22/2017 at 12:09 pm to PA [facility] who reports a CT angio is pending. ASPECTS [facility] Stroke Program Early CT Score) - Ganglionic level infarction (caudate, lentiform nuclei, internal capsule, insula, M1-M3 cortex): 7 - Supraganglionic infarction (M4-M6 cortex): 2 Total score (0-10 with 10 being normal): 9 IMPRESSION: 1. Possible small cortical infarct in the posterior right frontal lobe. ASPECTS is 9. 2. No acute hemorrhage. Electronically Signed   By: Marnee Spring M.D.   On: 07/22/2017 12:13     Assessment Luis [name] is a 61 year old male without a notable past medical history who presented to [facility] with complaints of left facial droop, slurred speech and dysphagia. CT head revealed a likely acute infarct in the posterior region of the right frontal lobe. It is likely possible that a lesion in this area is causing his symptoms. Proceed with routine stroke workup.  Plan  1. HgbA1c, fasting lipid panel 2. MRI of the brain without contrast 3. PT consult, OT consult, Speech consult 4. Echocardiogram 5. Carotid dopplers 6. Prophylactic therapy-Antiplatelet med: Aspirin - dose 325mg  daily, Plavix 75mg  daily 7. Risk factor modification 8. Telemetry monitoring 9. Frequent neuro checks 10. NPO until passes stroke swallow screen 11. BP goals permissive HTN less than 220 systolic  Leonel Ramsay PA-C [facility] [phone]  07/22/2017, 12:40 PM   NEUROHOSPITALIST ADDENDUM Seen patient and evaluated. Likely minor  ischemic stroke. CTA shows no stenosis. Obtain MRI. Etiology could be cardioembolic vs lacuar. LSN is 6 am, outside time window for TPA.   Georgiana Spinner Aroor MD [facility] [phone]  If 7pm to 7am, please call on call as listed on AMION.

## 2017-07-22 NOTE — ED Notes (Signed)
Patient transported to MRI 

## 2017-07-23 ENCOUNTER — Observation Stay (HOSPITAL_COMMUNITY): Payer: 59

## 2017-07-23 ENCOUNTER — Encounter (HOSPITAL_COMMUNITY): Payer: 59

## 2017-07-23 ENCOUNTER — Other Ambulatory Visit (HOSPITAL_COMMUNITY): Payer: 59

## 2017-07-23 ENCOUNTER — Encounter (HOSPITAL_COMMUNITY): Payer: Self-pay | Admitting: *Deleted

## 2017-07-23 DIAGNOSIS — Q211 Atrial septal defect: Secondary | ICD-10-CM | POA: Diagnosis not present

## 2017-07-23 DIAGNOSIS — Z8249 Family history of ischemic heart disease and other diseases of the circulatory system: Secondary | ICD-10-CM | POA: Diagnosis not present

## 2017-07-23 DIAGNOSIS — K119 Disease of salivary gland, unspecified: Secondary | ICD-10-CM | POA: Diagnosis not present

## 2017-07-23 DIAGNOSIS — I63411 Cerebral infarction due to embolism of right middle cerebral artery: Secondary | ICD-10-CM

## 2017-07-23 DIAGNOSIS — R1310 Dysphagia, unspecified: Secondary | ICD-10-CM | POA: Diagnosis present

## 2017-07-23 DIAGNOSIS — M25561 Pain in right knee: Secondary | ICD-10-CM | POA: Diagnosis present

## 2017-07-23 DIAGNOSIS — E214 Other specified disorders of parathyroid gland: Secondary | ICD-10-CM | POA: Diagnosis present

## 2017-07-23 DIAGNOSIS — R29818 Other symptoms and signs involving the nervous system: Secondary | ICD-10-CM | POA: Diagnosis not present

## 2017-07-23 DIAGNOSIS — R29810 Facial weakness: Secondary | ICD-10-CM | POA: Diagnosis present

## 2017-07-23 DIAGNOSIS — I10 Essential (primary) hypertension: Secondary | ICD-10-CM | POA: Diagnosis present

## 2017-07-23 DIAGNOSIS — I351 Nonrheumatic aortic (valve) insufficiency: Secondary | ICD-10-CM | POA: Diagnosis not present

## 2017-07-23 DIAGNOSIS — E785 Hyperlipidemia, unspecified: Secondary | ICD-10-CM | POA: Diagnosis present

## 2017-07-23 DIAGNOSIS — M1991 Primary osteoarthritis, unspecified site: Secondary | ICD-10-CM | POA: Diagnosis present

## 2017-07-23 DIAGNOSIS — I6340 Cerebral infarction due to embolism of unspecified cerebral artery: Secondary | ICD-10-CM | POA: Insufficient documentation

## 2017-07-23 DIAGNOSIS — Z888 Allergy status to other drugs, medicaments and biological substances status: Secondary | ICD-10-CM | POA: Diagnosis not present

## 2017-07-23 DIAGNOSIS — I638 Other cerebral infarction: Secondary | ICD-10-CM | POA: Diagnosis not present

## 2017-07-23 DIAGNOSIS — K116 Mucocele of salivary gland: Secondary | ICD-10-CM

## 2017-07-23 DIAGNOSIS — G8929 Other chronic pain: Secondary | ICD-10-CM | POA: Diagnosis present

## 2017-07-23 DIAGNOSIS — I639 Cerebral infarction, unspecified: Secondary | ICD-10-CM | POA: Diagnosis not present

## 2017-07-23 DIAGNOSIS — R001 Bradycardia, unspecified: Secondary | ICD-10-CM | POA: Diagnosis present

## 2017-07-23 DIAGNOSIS — R471 Dysarthria and anarthria: Secondary | ICD-10-CM | POA: Diagnosis present

## 2017-07-23 DIAGNOSIS — E860 Dehydration: Secondary | ICD-10-CM | POA: Diagnosis present

## 2017-07-23 DIAGNOSIS — Z823 Family history of stroke: Secondary | ICD-10-CM | POA: Diagnosis not present

## 2017-07-23 DIAGNOSIS — I63511 Cerebral infarction due to unspecified occlusion or stenosis of right middle cerebral artery: Secondary | ICD-10-CM | POA: Diagnosis present

## 2017-07-23 DIAGNOSIS — R2990 Unspecified symptoms and signs involving the nervous system: Secondary | ICD-10-CM | POA: Diagnosis not present

## 2017-07-23 DIAGNOSIS — I472 Ventricular tachycardia: Secondary | ICD-10-CM | POA: Diagnosis not present

## 2017-07-23 LAB — C-REACTIVE PROTEIN

## 2017-07-23 LAB — SEDIMENTATION RATE: Sed Rate: 6 mm/hr (ref 0–16)

## 2017-07-23 LAB — LIPID PANEL
CHOL/HDL RATIO: 3.9 ratio
Cholesterol: 221 mg/dL — ABNORMAL HIGH (ref 0–200)
HDL: 57 mg/dL (ref >40)
LDL CALC: 144 mg/dL — AB (ref 0–99)
TRIGLYCERIDES: 102 mg/dL (ref <150)
VLDL: 20 mg/dL (ref 0–40)

## 2017-07-23 LAB — ECHOCARDIOGRAM COMPLETE
Height: 68 in
WEIGHTICAEL: 2680.79 [oz_av]

## 2017-07-23 LAB — HEMOGLOBIN A1C
Hgb A1c MFr Bld: 5.7 % — ABNORMAL HIGH (ref 4.8–5.6)
Mean Plasma Glucose: 116.89 mg/dL

## 2017-07-23 MED ORDER — ASPIRIN EC 325 MG PO TBEC
325.0000 mg | DELAYED_RELEASE_TABLET | Freq: Every day | ORAL | Status: DC
  Administered 2017-07-23 – 2017-07-26 (×4): 325 mg via ORAL
  Filled 2017-07-23 (×4): qty 1

## 2017-07-23 MED ORDER — ATORVASTATIN CALCIUM 80 MG PO TABS: 80.0000 mg | ORAL_TABLET | Freq: Every day | ORAL | Status: DC

## 2017-07-23 MED ORDER — ATORVASTATIN CALCIUM 40 MG PO TABS
40.0000 mg | ORAL_TABLET | Freq: Every day | ORAL | Status: DC
  Administered 2017-07-23 – 2017-07-26 (×3): 40 mg via ORAL
  Filled 2017-07-23 (×3): qty 1

## 2017-07-23 NOTE — Progress Notes (Signed)
STROKE TEAM PROGRESS NOTE   HISTORY OF PRESENT ILLNESS (per record) Luis [name] is an 61 y.o. male without any known medical conditions presented to [facility] with complaints of left facial droop, slurred speech and dysphagia. Luis [name] stated that he woke at 0600 and spoke to his mother without symptoms. He went back to bed and woke again at 0930 with the above symptoms. He presented to [facility] for evaluation and treatment.  Pertinent Imaging CT head 07/22/17: Possible small cortical infarct in the posterior right frontal lobe CTA head/neck 07/22/17: Negative CTA of the head neck  Date last known well: Date: 07/22/2017 Time last known well: Time: 06:00  tPA Given: No: Not LVO, minimal symptoms  Modified Rankin Score: 0   SUBJECTIVE (INTERVAL HISTORY) His wife and daughter are at the bedside.  He is sitting for lunch. He still has mild left facial droop but otherwise intact. He denies heart palpitation, but admitted HLD not compliant with medication.    OBJECTIVE Temp:  [98.3 F (36.8 C)-98.9 F (37.2 C)] 98.6 F (37 C) (08/18 0850) Pulse Rate:  [44-67] 66 (08/18 0850) Cardiac Rhythm: Sinus bradycardia (08/18 0700) Resp:  [13-20] 20 (08/18 0850) BP: (96-125)/(64-80) 105/64 (08/18 0850) SpO2:  [93 %-99 %] 97 % (08/18 0850) Weight:  [167 lb 8.8 oz (76 kg)] 167 lb 8.8 oz (76 kg) (08/17 1954)  CBC:   Recent Labs Lab 07/22/17 1200 07/22/17 1207  WBC 5.9  --   NEUTROABS 3.3  --   HGB 14.1 15.0  HCT 41.8 44.0  MCV 85.8  --   PLT 197  --     Basic Metabolic Panel:   Recent Labs Lab 07/22/17 1200 07/22/17 1207  NA 139 139  K 4.0 4.3  CL 105 104  CO2 26  --   GLUCOSE 95 92  BUN 19 27*  CREATININE 1.13 1.00  CALCIUM 9.1  --     Lipid Panel:     Component Value Date/Time   CHOL 221 (H) 07/23/2017 0450   TRIG 102 07/23/2017 0450   HDL 57 07/23/2017 0450   CHOLHDL 3.9 07/23/2017 0450   VLDL 20 07/23/2017 0450   LDLCALC 144 (H) 07/23/2017 0450   HgbA1c:   Lab Results  Component Value Date   HGBA1C 5.7 (H) 07/23/2017   Urine Drug Screen:     Component Value Date/Time   LABOPIA NONE DETECTED 07/22/2017 1421   COCAINSCRNUR NONE DETECTED 07/22/2017 1421   LABBENZ NONE DETECTED 07/22/2017 1421   AMPHETMU NONE DETECTED 07/22/2017 1421   THCU NONE DETECTED 07/22/2017 1421   LABBARB NONE DETECTED 07/22/2017 1421    Alcohol Level     Component Value Date/Time   ETH <5 07/22/2017 1200    IMAGING I have personally reviewed the radiological images below and agree with the radiology interpretations.  Ct Angio Head W Or Wo Contrast Ct Angio Neck W Or Wo Contrast 07/22/2017 Negative CTA of the head neck. No emergent large vessel occlusion, proximal stenosis, or atheromatous changes.  11 mm sclerotic focus in the T3 body is consistent with a bone island in the absence of malignancy history.   Mr Brain Wo Contrast 07/22/2017 1. Right posterolateral frontal lobe acute/early subacute infarction, volume 3.7 cc. No acute hemorrhage.  2. Otherwise unremarkable MRI of the brain.  3. 8 mm lesion within the superior aspect of superficial lobe of left parotid gland with soft tissue attenuation on prior CT angiogram. Differential includes salivary neoplasm and proteinaceous cyst, unlikely lymph node  given morphology. This area is likely amenable to ultrasound evaluation to further characterize.   Ct Head Code Stroke Wo Contrast 07/22/2017 1. Possible small cortical infarct in the posterior right frontal lobe. ASPECTS is 9.  2. No acute hemorrhage.   TTE pending  LE venous doppler pending  TEE pending  Neck soft tissue US pending   PHYSICAL EXAM Vitals:   07/23/17 0200 07/23/17 0425 07/23/17 0700 07/23/17 0850  BP: 120/74 109/66 97/64 105/64  Pulse: 64 (!) 50 67 66  Resp: 18 18 18 20   Temp:  98.9 F (37.2 C)  98.6 F (37 C)  TempSrc:  Oral  Oral  SpO2: 98% 97% 97% 97%  Weight:      Height:        Temp:  [98.5 F (36.9 C)-98.9 F  (37.2 C)] 98.6 F (37 C) (08/18 0850) Pulse Rate:  [44-67] 66 (08/18 0850) Resp:  [13-20] 20 (08/18 0850) BP: (96-125)/(64-77) 105/64 (08/18 0850) SpO2:  [93 %-99 %] 97 % (08/18 0850) Weight:  [167 lb 8.8 oz (76 kg)] 167 lb 8.8 oz (76 kg) (08/17 1954)  General - Well nourished, well developed, in no apparent distress.  Ophthalmologic - Sharp disc margins OU.  Cardiovascular - Regular rate and rhythm.  Mental Status -  Level of arousal and orientation to time, place, and person were intact. Language including expression, naming, repetition, comprehension was assessed and found intact. Attention span and concentration were normal. Fund of Knowledge was assessed and was intact.  Cranial Nerves II - XII - II - Visual field intact OU. III, IV, VI - Extraocular movements intact. V - Facial sensation intact bilaterally. VII - mild left facial droop. VIII - Hearing & vestibular intact bilaterally. X - Palate elevates symmetrically. XI - Chin turning & shoulder shrug intact bilaterally. XII - Tongue protrusion intact.  Motor Strength - The patient's strength was normal in all extremities and pronator drift was absent.  Bulk was normal and fasciculations were absent.   Motor Tone - Muscle tone was assessed at the neck and appendages and was normal.  Reflexes - The patient's reflexes were 1+ in all extremities and he had no pathological reflexes.  Sensory - Light touch, temperature/pinprick, vibration and proprioception were assessed and were symmetrical.    Coordination - The patient had normal movements in the hands and feet with no ataxia or dysmetria.  Tremor was absent.  Gait and Station - deferred   ASSESSMENT/PLAN [name] is a 61 y.o. male with no significant past medical history of  presenting with left facial droop, slurred speech and dysphagia. He did not receive IV t-PA due to minimal deficits.  Stroke:  right frontal cortical infarct, right MCA territory -  possibly embolic - source unknown.  Resultant  Left mild facial droop  CT head - small cortical infarct in the posterior right frontal lobe.  MRI head - Right posterolateral frontal lobe acute/early subacute infarction  CTA H&N - unremarkable   2D Echo - pending  LE venous doppler pending  Due to relative young age and lack of significant risk factors, hypercoagulable work up - pending  Recommend TEE and loop for embolic work up  LDL - 144  HgbA1c - 5.7  UDS neg  VTE prophylaxis - SCDs Diet Heart Room service appropriate? Yes; Fluid consistency: Thin  No antithrombotic prior to admission, now on aspirin 325 mg daily and clopidogrel 75 mg daily. Continue DAPT as per POINT trial.   Patient counseled to  be compliant with his antithrombotic medications  Ongoing aggressive stroke risk factor management  Therapy recommendations: Outpt PT  Disposition: Pending  Hyperlipidemia  Home meds:  None  LDL 144, goal < 70  Now on Lipitor 80 mg daily  Continue statin at discharge  Other Stroke Risk Factors  Advanced age  Other Active Problems  8 mm lesion within the superior aspect of superficial lobe of left parotid gland -> neck ultrasound pending.  Mildly elevated BUN - 27 - possible mild dehydration.  Occasional mild hypotension - 97/64 - currently on no antihypertensive medications  Hospital day # 0  Marvel Plan, MD PhD Stroke Neurology 07/23/2017 1:25 PM   To contact Stroke Continuity provider, please refer to [email or web address]. After hours, contact General Neurology

## 2017-07-23 NOTE — Progress Notes (Signed)
Patient ID: Luis [name], male   DOB: 1956/04/22, 61 y.o.   MRN: [id number]                                                                PROGRESS NOTE                                                                                                                                                                                                             Patient Demographics:    Luis [name], is a 61 y.o. male, DOB - 05/20/1956, WUJ:[id number]  Admit date - 07/22/2017   Admitting Physician Haydee Salter, MD  Outpatient Primary MD for the patient is Patient, No Pcp Per  LOS - 0  Outpatient Specialists     Chief Complaint  Patient presents with  . Code Stroke       Brief Narrative 61 y.o. male with medical history significant for dyslipidemia for which he stopped taking medications due to reports of knee pain and family history of CVA and MI. Patient reports that he initially awakened around 6:00 this morning and was in his usual state of health. Went back to sleep and got up at 9:30 AM to go eat breakfast. While eating breakfast he noticed he was having difficulty drinking his coffee and swallowing his foods. His wife noted left side facial drooping and he was aware that he was having difficulty speaking and that he "sounded drunk". He presented to the ER but due to mild symptomatology was not a candidate for TPA although code stroke was initiated and he was evaluated by neurology. Initial CT of the head demonstrated a possible small cortical infarct in the posterior right frontal lobe without acute hemorrhage. CTA head and neck were negative and no large vessel issues determined. He was also an incidental finding of an 11 mm sclerotic focus in the T3 body consistent with a bone island in the absence of a malignancy history.  ED Course:  Vital Signs: BP 125/80 (BP Location: Right Arm)   Pulse (!) 53   Temp 98.3 F (36.8 C) (Oral)   Resp 16   SpO2 99%  CT head without contrast: As  above CTA head and neck: As above Lab data: Sodium 139, potassium 4.0, chloride 105, CO2 26, glucose 95, BUN 19,  creatinine 1.13, anion gap 8, LFTs normal, poc troponin negative, white count 5900 and normal differential, hemoglobin 14.1, platelets 197,000, PT 12.9, INR 0.97, PTT 32, alcohol <5 Medications and treatments: None   Subjective:    Luis [name] today has slight dysarthria, left facial droop resolved.  Pt denies headache,  Vision change, weakness, numbness, tingling.    No chest pain, No abdominal pain - No Nausea, No new weakness tingling or numbness, No Cough - SOB.    Assessment  & Plan :    Principal Problem:   Stroke-like symptoms Active Problems:   HLD (hyperlipidemia)   OA (osteoarthritis)   Stroke R posterolateral frontal lobe acute/early subactue infarct 3.7cc -Presents with abrupt onset of left facial drooping and dysarthria as well as mild difficulty managing oral secretions with CT showing possible acute infarct MRI brain => see above -SLP evaluation: Recommendation is for regular diet with thin liquids -PT/OT evaluation -Frequent neurological checks -Carotid duplex although may be able to cancel since did undergo CTA head and neck -HgbA1c=5.7, and lipid LDL=144 -Aspirin, start Lipitor 80mg  po qday Echo pending -Neurology consulted, appreciate input  8mm lesion in the superior aspect of the left parotid gland  Ultrasound ordered    HLD (hyperlipidemia) -States was on simvastatin in the past but quit taking due to knee pain he presumed was related to statin -start Lipitor    OA (osteoarthritis) -Chronic right knee pain that limits mobility but is never been formally diagnosed with osteoarthritis         Code Status : FULL CODE  Family Communication  : w patient  Disposition Plan  : home  Barriers For Discharge :   Consults  :  neurology  Procedures  :   DVT Prophylaxis  :   - SCDs   Lab Results  Component Value Date   PLT  197 07/22/2017    Antibiotics  :  none  Anti-infectives    None        Objective:   Vitals:   07/22/17 2200 07/23/17 0000 07/23/17 0200 07/23/17 0425  BP: 117/70 124/68 120/74 109/66  Pulse: 64 60 64 (!) 50  Resp: 18 17 18 18   Temp:    98.9 F (37.2 C)  TempSrc:    Oral  SpO2: 98% 97% 98% 97%  Weight:      Height:        Wt Readings from Last 3 Encounters:  07/22/17 76 kg (167 lb 8.8 oz)     Intake/Output Summary (Last 24 hours) at 07/23/17 4098 Last data filed at 07/23/17 0600  Gross per 24 hour  Intake             92.5 ml  Output                0 ml  Net             92.5 ml     Physical Exam  Awake Alert, Oriented X 3, No new F.N deficits, Normal affect [place].AT,PERRAL Supple Neck,No JVD, No cervical lymphadenopathy appriciated.  Symmetrical Chest wall movement, Good air movement bilaterally, CTAB RRR,No Gallops,Rubs or new Murmurs, No Parasternal Heave +ve B.Sounds, Abd Soft, No tenderness, No organomegaly appriciated, No rebound - guarding or rigidity. No Cyanosis, Clubbing or edema, No new Rash or bruise     Data Review:    CBC  Recent Labs Lab 07/22/17 1200 07/22/17 1207  WBC 5.9  --   HGB 14.1 15.0  HCT 41.8 44.0  PLT 197  --   MCV 85.8  --   MCH 29.0  --   MCHC 33.7  --   RDW 14.3  --   LYMPHSABS 2.0  --   MONOABS 0.4  --   EOSABS 0.2  --   BASOSABS 0.0  --     Chemistries   Recent Labs Lab 07/22/17 1200 07/22/17 1207  NA 139 139  K 4.0 4.3  CL 105 104  CO2 26  --   GLUCOSE 95 92  BUN 19 27*  CREATININE 1.13 1.00  CALCIUM 9.1  --   AST 22  --   ALT 22  --   ALKPHOS 78  --   BILITOT 1.1  --    ------------------------------------------------------------------------------------------------------------------  Recent Labs  07/23/17 0450  CHOL 221*  HDL 57  LDLCALC 144*  TRIG 102  CHOLHDL 3.9    Lab Results  Component Value Date   HGBA1C 5.7 (H) 07/23/2017    ------------------------------------------------------------------------------------------------------------------ No results for input(s): TSH, T4TOTAL, T3FREE, THYROIDAB in the last 72 hours.  Invalid input(s): FREET3 ------------------------------------------------------------------------------------------------------------------ No results for input(s): VITAMINB12, FOLATE, FERRITIN, TIBC, IRON, RETICCTPCT in the last 72 hours.  Coagulation profile  Recent Labs Lab 07/22/17 1200  INR 0.97    No results for input(s): DDIMER in the last 72 hours.  Cardiac Enzymes No results for input(s): CKMB, TROPONINI, MYOGLOBIN in the last 168 hours.  Invalid input(s): CK ------------------------------------------------------------------------------------------------------------------ No results found for: BNP  Inpatient Medications  Scheduled Meds: . aspirin  300 mg Rectal Daily  . clopidogrel  75 mg Oral Daily  . enoxaparin (LOVENOX) injection  40 mg Subcutaneous Q24H   Continuous Infusions: . sodium chloride 10 mL/hr at 07/22/17 2045   PRN Meds:.acetaminophen **OR** acetaminophen (TYLENOL) oral liquid 160 mg/5 mL **OR** acetaminophen, labetalol  Micro Results No results found for this or any previous visit (from the past 240 hour(s)).  Radiology Reports Ct Angio Head W Or Wo Contrast  Result Date: 07/22/2017 CLINICAL DATA:  Left facial droop and slurred speech EXAM: CT ANGIOGRAPHY HEAD AND NECK TECHNIQUE: Multidetector CT imaging of the head and neck was performed using the standard protocol during bolus administration of intravenous contrast. Multiplanar CT image reconstructions and MIPs were obtained to evaluate the vascular anatomy. Carotid stenosis measurements (when applicable) are obtained utilizing NASCET criteria, using the distal internal carotid diameter as the denominator. CONTRAST:  50 cc Isovue 370 intravenous COMPARISON:  Noncontrast head CT from earlier the same day  FINDINGS: CTA NECK FINDINGS Aortic arch: Normal appearance.  Three vessel branching. Right carotid system: Vessels are smooth and widely patent. No noted atheromatous changes. Left carotid system: Vessels are smooth and widely patent. No noted atheromatous changes. Vertebral arteries: No proximal subclavian stenosis. Vertebral arteries are smooth and widely patent. Dominant left vertebral artery. Skeleton: No acute or aggressive finding cervical disc degeneration. There is an 11 mm sclerotic focus in the right aspect of the L3 body, 11 mm in diameter. Other neck: No noted mass or inflammation. Upper chest: Negative. Review of the MIP images confirms the above findings CTA HEAD FINDINGS Anterior circulation: No branch occlusion, stenosis, or aneurysm. Negative for beading Posterior circulation: Left dominant vertebral artery. Standard vertebrobasilar branching. No branch occlusion or flow limiting stenosis. Negative for beading or aneurysm. Venous sinuses: Widely patent. Anatomic variants: None significant Delayed phase: Not performed in the emergent setting Review of the MIP images confirms the above findings IMPRESSION: Negative CTA of the head neck. No emergent large vessel  occlusion, proximal stenosis, or atheromatous changes. 11 mm sclerotic focus in the T3 body is consistent with a bone island in the absence of malignancy history. Electronically Signed   By: Marnee Spring M.D.   On: 07/22/2017 12:42   Ct Angio Neck W Or Wo Contrast  Result Date: 07/22/2017 CLINICAL DATA:  Left facial droop and slurred speech EXAM: CT ANGIOGRAPHY HEAD AND NECK TECHNIQUE: Multidetector CT imaging of the head and neck was performed using the standard protocol during bolus administration of intravenous contrast. Multiplanar CT image reconstructions and MIPs were obtained to evaluate the vascular anatomy. Carotid stenosis measurements (when applicable) are obtained utilizing NASCET criteria, using the distal internal carotid  diameter as the denominator. CONTRAST:  50 cc Isovue 370 intravenous COMPARISON:  Noncontrast head CT from earlier the same day FINDINGS: CTA NECK FINDINGS Aortic arch: Normal appearance.  Three vessel branching. Right carotid system: Vessels are smooth and widely patent. No noted atheromatous changes. Left carotid system: Vessels are smooth and widely patent. No noted atheromatous changes. Vertebral arteries: No proximal subclavian stenosis. Vertebral arteries are smooth and widely patent. Dominant left vertebral artery. Skeleton: No acute or aggressive finding cervical disc degeneration. There is an 11 mm sclerotic focus in the right aspect of the L3 body, 11 mm in diameter. Other neck: No noted mass or inflammation. Upper chest: Negative. Review of the MIP images confirms the above findings CTA HEAD FINDINGS Anterior circulation: No branch occlusion, stenosis, or aneurysm. Negative for beading Posterior circulation: Left dominant vertebral artery. Standard vertebrobasilar branching. No branch occlusion or flow limiting stenosis. Negative for beading or aneurysm. Venous sinuses: Widely patent. Anatomic variants: None significant Delayed phase: Not performed in the emergent setting Review of the MIP images confirms the above findings IMPRESSION: Negative CTA of the head neck. No emergent large vessel occlusion, proximal stenosis, or atheromatous changes. 11 mm sclerotic focus in the T3 body is consistent with a bone island in the absence of malignancy history. Electronically Signed   By: Marnee Spring M.D.   On: 07/22/2017 12:42   Mr Brain Wo Contrast  Result Date: 07/22/2017 CLINICAL DATA:  61 y/o  M; slurred speech and left facial drooping. EXAM: MRI HEAD WITHOUT CONTRAST TECHNIQUE: Multiplanar, multiecho pulse sequences of the brain and surrounding structures were obtained without intravenous contrast. COMPARISON:  07/22/2017 CT angiogram of the head and CT of the head. FINDINGS: Brain: Right  posterolateral frontal lobe reduced diffusion measuring 1.5 x 1.5 x 3.1 cm (volume = 3.7 cm^3) (AP x ML x CC series 3, image 31 and series 4, image 19) compatible with acute/ early subacute infarction. There are a few additional punctate foci of reduced diffusion in the right anterolateral frontal lobe. The infarct demonstrates mild T2 FLAIR hyperintense signal abnormality. No significant mass effect. No evidence for intracranial hemorrhage, hydrocephalus, or herniation. Vascular: Normal flow voids. Skull and upper cervical spine: Normal marrow signal. Sinuses/Orbits: Negative. Other: Well-circumscribed 8 mm T2 hyperintense, incomplete FLAIR suppression, T1 hypointense focus within superior aspect of the superficial lobe of left parotid gland with soft tissue attenuation on prior CT angiogram (series 6, image 2). IMPRESSION: 1. Right posterolateral frontal lobe acute/early subacute infarction, volume 3.7 cc. No acute hemorrhage. 2. Otherwise unremarkable MRI of the brain. 3. 8 mm lesion within the superior aspect of superficial lobe of left parotid gland with soft tissue attenuation on prior CT angiogram. Differential includes salivary neoplasm and proteinaceous cyst, unlikely lymph node given morphology. This area is likely amenable to ultrasound evaluation  to further characterize. Electronically Signed   By: Mitzi Hansen M.D.   On: 07/22/2017 17:05   Ct Head Code Stroke Wo Contrast  Result Date: 07/22/2017 CLINICAL DATA:  Code stroke.  Left facial droop and slurred speech. EXAM: CT HEAD WITHOUT CONTRAST TECHNIQUE: Contiguous axial images were obtained from the base of the skull through the vertex without intravenous contrast. COMPARISON:  None. FINDINGS: Brain: Subtle area of gray-white blurring in the lateral and posterior right frontal lobe, also seen on sagittal reformats. No hemorrhage, hydrocephalus, or atrophy. Vascular: No hyperdense vessel. Minimal atherosclerotic calcification Skull: No  acute or aggressive finding. Sinuses/Orbits: Negative Other: These results were called by telephone at the time of interpretation on 07/22/2017 at 12:09 pm to PA [facility] who reports a CT angio is pending. ASPECTS [facility] Stroke Program Early CT Score) - Ganglionic level infarction (caudate, lentiform nuclei, internal capsule, insula, M1-M3 cortex): 7 - Supraganglionic infarction (M4-M6 cortex): 2 Total score (0-10 with 10 being normal): 9 IMPRESSION: 1. Possible small cortical infarct in the posterior right frontal lobe. ASPECTS is 9. 2. No acute hemorrhage. Electronically Signed   By: Marnee Spring M.D.   On: 07/22/2017 12:13    Time Spent in minutes  30   Pearson Grippe M.D on 07/23/2017 at 7:02 AM  Between 7am to 7pm - Pager - [phone]  After 7pm go to [email or web address] - password [facility]  [facility] -  Office  [phone]

## 2017-07-23 NOTE — Progress Notes (Signed)
Physical Therapy Treatment Patient Details Name: Luis [name] MRN: [id number] DOB: 1956-01-31 Today's Date: 07/23/2017    History of Present Illness Luis [name] is an 61 y.o. male without any known medical conditions presented to [facility] with complaints of left facial droop, slurred speech and dysphagia.  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     PMH:  HLD    PT Comments    Pt seen for mobility progression and higher level balance tasks. Pt making great progress towards achieving his current functional goals. Pt would continue to benefit from skilled physical therapy services at this time while admitted and after d/c to address the below listed limitations in order to improve overall safety and independence with functional mobility.    Follow Up Recommendations  Outpatient PT;Other (comment) (NEURO OP PT)     Equipment Recommendations  None recommended by PT    Recommendations for Other Services       Precautions / Restrictions Precautions Precautions: Fall Restrictions Weight Bearing Restrictions: No    Mobility  Bed Mobility               General bed mobility comments: Pt OOB in chair upon arrival  Transfers Overall transfer level: Needs assistance Equipment used: None Transfers: Sit to/from Stand Sit to Stand: Supervision         General transfer comment: for safety, no instability  Ambulation/Gait Ambulation/Gait assistance: Min guard Ambulation Distance (Feet): 300 Feet Assistive device: None Gait Pattern/deviations: Step-through pattern;Decreased stride length Gait velocity: decreased Gait velocity interpretation: Below normal speed for age/gender General Gait Details: no instability or LOB on level ground without distractions, min guard for safety   Stairs            Wheelchair Mobility    Modified Rankin (Stroke Patients Only) Modified Rankin (Stroke Patients Only) Pre-Morbid Rankin Score: No  symptoms Modified Rankin: Slight disability     Balance Overall balance assessment: Needs assistance Sitting-balance support: Feet supported;No upper extremity supported Sitting balance-Leahy Scale: Normal     Standing balance support: No upper extremity supported;During functional activity Standing balance-Leahy Scale: Good               High level balance activites: Side stepping;Direction changes;Turns;Head turns High Level Balance Comments: pt with some instability with challenges to higher level balance; however, no overt LOB or need for physical assistance, min guard for safety            Cognition Arousal/Alertness: Awake/alert Behavior During Therapy: WFL for tasks assessed/performed Overall Cognitive Status: Within Functional Limits for tasks assessed                                        Exercises      General Comments        Pertinent Vitals/Pain Pain Assessment: No/denies pain    Home Living Family/patient expects to be discharged to:: Private residence Living Arrangements: Parent                  Prior Function            PT Goals (current goals can now be found in the care plan section) Acute Rehab PT Goals PT Goal Formulation: With patient Time For Goal Achievement: 07/29/17 Potential to Achieve Goals: Good Progress towards PT goals: Progressing toward goals    Frequency    Min 4X/week      PT  Plan Current plan remains appropriate    Co-evaluation              AM-PAC PT "6 Clicks" Daily Activity  Outcome Measure  Difficulty turning over in bed (including adjusting bedclothes, sheets and blankets)?: None Difficulty moving from lying on back to sitting on the side of the bed? : None Difficulty sitting down on and standing up from a chair with arms (e.g., wheelchair, bedside commode, etc,.)?: None Help needed moving to and from a bed to chair (including a wheelchair)?: None Help needed walking in  hospital room?: A Little Help needed climbing 3-5 steps with a railing? : A Little 6 Click Score: 22    End of Session Equipment Utilized During Treatment: Gait belt Activity Tolerance: Patient tolerated treatment well Patient left: in chair;with call bell/phone within reach;with chair alarm set Nurse Communication: Mobility status PT Visit Diagnosis: Unsteadiness on feet (R26.81);Other abnormalities of gait and mobility (R26.89)     Time: 1610-9604 PT Time Calculation (min) (ACUTE ONLY): 18 min  Charges:  $Gait Training: 8-22 mins                    G Codes:       [place], [place], [place] [phone]    Alessandra Bevels Luis [name] 07/23/2017, 4:12 PM

## 2017-07-23 NOTE — Evaluation (Signed)
Occupational Therapy Evaluation and Discharge Patient Details Name: Luis [name] MRN: [id number] DOB: 02/17/1956 Today's Date: 07/23/2017    History of Present Illness Luis [name] is an 61 y.o. male without any known medical conditions presented to [facility] with complaints of left facial droop, slurred speech and dysphagia.  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     PMH:  HLD   Clinical Impression   Pt reports he was independent with ADL PTA. Currently pt requires supervision for ADL and functional mobility. Pt presenting with mild balance deficits and word finding difficulties. Pt planning to d/c home with 24/7 supervision from family. No further acute OT needs identified; signing off at this time. Please re-consult if needs change. Thank you for this referral.    Follow Up Recommendations  No OT follow up;Supervision - Intermittent    Equipment Recommendations  None recommended by OT    Recommendations for Other Services       Precautions / Restrictions Precautions Precautions: Fall Restrictions Weight Bearing Restrictions: No      Mobility Bed Mobility               General bed mobility comments: Pt OOB in chair upon arrival  Transfers Overall transfer level: Needs assistance Equipment used: None Transfers: Sit to/from Stand Sit to Stand: Supervision         General transfer comment: for safety, no unsteadiness     Balance Overall balance assessment: Needs assistance Sitting-balance support: Feet supported;No upper extremity supported Sitting balance-Leahy Scale: Normal     Standing balance support: No upper extremity supported;During functional activity Standing balance-Leahy Scale: Good                             ADL either performed or assessed with clinical judgement   ADL Overall ADL's : Needs assistance/impaired Eating/Feeding: Independent;Sitting   Grooming: Supervision/safety;Standing;Wash/dry  hands;Oral care   Upper Body Bathing: Set up;Sitting   Lower Body Bathing: Supervison/ safety;Sit to/from stand   Upper Body Dressing : Set up;Sitting   Lower Body Dressing: Supervision/safety;Sit to/from stand   Toilet Transfer: Supervision/safety;Ambulation       Tub/ Shower Transfer: Supervision/safety;Ambulation;Tub transfer   Functional mobility during ADLs: Supervision/safety       Vision Baseline Vision/History: Wears glasses Wears Glasses: Reading only Patient Visual Report: No change from baseline Vision Assessment?: No apparent visual deficits     Perception     Praxis      Pertinent Vitals/Pain Pain Assessment: No/denies pain     Hand Dominance Right   Extremity/Trunk Assessment Upper Extremity Assessment Upper Extremity Assessment: Overall WFL for tasks assessed   Lower Extremity Assessment Lower Extremity Assessment: Defer to PT evaluation   Cervical / Trunk Assessment Cervical / Trunk Assessment: Normal   Communication Communication Communication: Expressive difficulties (word finding, slow/purposeful speech)   Cognition Arousal/Alertness: Awake/alert Behavior During Therapy: WFL for tasks assessed/performed Overall Cognitive Status: Within Functional Limits for tasks assessed                                     General Comments       Exercises     Shoulder Instructions      Home Living Family/patient expects to be discharged to:: Private residence Living Arrangements: Parent Available Help at Discharge: Family;Available 24 hours/day Type of Home: House Home Access: Stairs to enter [employer] of  Steps: 2 Entrance Stairs-Rails: Right;Left Home Layout: One level     Bathroom Shower/Tub: Tub/shower unit;Door   [employer] Toilet: Standard     Home Equipment: Toilet riser          Prior Functioning/Environment Level of Independence: Independent        Comments: 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        OT Problem List:        OT Treatment/Interventions:      OT Goals(Current goals can be found in the care plan section) Acute Rehab OT Goals Patient Stated Goal: To get back to normal OT Goal Formulation: All assessment and education complete, DC therapy  OT Frequency:     Barriers to D/C:            Co-evaluation              AM-PAC PT "6 Clicks" Daily Activity     Outcome Measure Help from another person eating meals?: None Help from another person taking care of personal grooming?: A Little Help from another person toileting, which includes using toliet, bedpan, or urinal?: A Little Help from another person bathing (including washing, rinsing, drying)?: A Little Help from another person to put on and taking off regular upper body clothing?: None Help from another person to put on and taking off regular lower body clothing?: A Little 6 Click Score: 20   End of Session Equipment Utilized During Treatment: Gait belt  Activity Tolerance: Patient tolerated treatment well Patient left: in chair;with call bell/phone within reach;with chair alarm set;with family/visitor present  OT Visit Diagnosis: Unsteadiness on feet (R26.81)                Time: 3005-1102 OT Time Calculation (min): 20 min Charges:  OT General Charges $OT Visit: 1 Procedure OT Evaluation $OT Eval Moderate Complexity: 1 Procedure G-Codes: OT G-codes **NOT FOR INPATIENT CLASS** Functional Assessment Tool Used: Clinical judgement Functional Limitation: Self care Self Care Current Status (T1173): At least 1 percent but less than 20 percent impaired, limited or restricted Self Care Goal Status (V6701): At least 1 percent but less than 20 percent impaired, limited or restricted Self Care Discharge Status [phone]): At least 1 percent but less than 20 percent impaired, limited or restricted   Fredric Mare A. Brett Albino, M.S., OTR/L Pager: [phone]  Gaye Alken 07/23/2017, 11:13 AM

## 2017-07-23 NOTE — Progress Notes (Signed)
  Echocardiogram  has been performed.  Pieter Partridge 07/23/2017, 2:52 PM

## 2017-07-24 ENCOUNTER — Inpatient Hospital Stay (HOSPITAL_COMMUNITY): Payer: 59

## 2017-07-24 ENCOUNTER — Other Ambulatory Visit: Payer: Self-pay | Admitting: Adult Health

## 2017-07-24 DIAGNOSIS — R55 Syncope and collapse: Secondary | ICD-10-CM

## 2017-07-24 DIAGNOSIS — K116 Mucocele of salivary gland: Secondary | ICD-10-CM

## 2017-07-24 DIAGNOSIS — I639 Cerebral infarction, unspecified: Secondary | ICD-10-CM

## 2017-07-24 DIAGNOSIS — R29818 Other symptoms and signs involving the nervous system: Secondary | ICD-10-CM

## 2017-07-24 LAB — COMPREHENSIVE METABOLIC PANEL
ALBUMIN: 3.2 g/dL — AB (ref 3.5–5.0)
ALK PHOS: 70 U/L (ref 38–126)
ALT: 17 U/L (ref 17–63)
AST: 17 U/L (ref 15–41)
Anion gap: 8 (ref 5–15)
BILIRUBIN TOTAL: 0.9 mg/dL (ref 0.3–1.2)
BUN: 21 mg/dL — AB (ref 6–20)
CALCIUM: 9 mg/dL (ref 8.9–10.3)
CO2: 25 mmol/L (ref 22–32)
Chloride: 104 mmol/L (ref 101–111)
Creatinine, Ser: 1.17 mg/dL (ref 0.61–1.24)
GFR calc Af Amer: >60 mL/min (ref >60)
GFR calc non Af Amer: >60 mL/min (ref >60)
GLUCOSE: 103 mg/dL — AB (ref 65–99)
Potassium: 3.9 mmol/L (ref 3.5–5.1)
SODIUM: 137 mmol/L (ref 135–145)
TOTAL PROTEIN: 5.8 g/dL — AB (ref 6.5–8.1)

## 2017-07-24 LAB — CBC
HEMATOCRIT: 40.4 % (ref 39.0–52.0)
Hemoglobin: 13.5 g/dL (ref 13.0–17.0)
MCH: 28.5 pg (ref 26.0–34.0)
MCHC: 33.4 g/dL (ref 30.0–36.0)
MCV: 85.2 fL (ref 78.0–100.0)
Platelets: 194 10*3/uL (ref 150–400)
RBC: 4.74 MIL/uL (ref 4.22–5.81)
RDW: 14.2 % (ref 11.5–15.5)
WBC: 7.7 10*3/uL (ref 4.0–10.5)

## 2017-07-24 LAB — LUPUS ANTICOAGULANT PANEL
DRVVT: 42.4 s (ref 0.0–47.0)
PTT Lupus Anticoagulant: 32.9 s (ref 0.0–51.9)

## 2017-07-24 NOTE — Progress Notes (Signed)
Patient ID: Luis [name], male   DOB: 04-Apr-1956, 61 y.o.   MRN: [id number]                                                                PROGRESS NOTE                                                                                                                                                                                                             Patient Demographics:    Luis [name], is a 61 y.o. male, DOB - 10-Jul-1956, NGE:[id number]  Admit date - 07/22/2017   Admitting Physician Haydee Salter, MD  Outpatient Primary MD for the patient is Patient, No Pcp Per  LOS - 1  Outpatient Specialists:     Chief Complaint  Patient presents with  . Code Stroke       Brief Narrative  61 y.o.malewith medical history significant for dyslipidemia for which he stopped taking medications due to reports of knee pain and family history of CVA and MI. Patient reports that he initially awakenedaround 6:00 this morning and was in his usual state of health. Went back to sleep and got up at 9:30 AM to go eat breakfast. While eating breakfast he noticed he was having difficulty drinking his coffee and swallowing his foods. His wife noted leftside facial drooping and he was aware that he was having difficulty speaking and that he "sounded drunk". He presented to the ER but due to mild symptomatology was not a candidate for TPA although code stroke was initiated and he was evaluated by neurology. Initial CT of the head demonstrated a possible small cortical infarct in the posterior right frontal lobe without acute hemorrhage. CTA head and neck were negative and no large vessel issues determined. He was also an incidental finding of an 11 mm sclerotic focus in the T3 body consistent with a bone island in the absence of a malignancy history.  ED Course: Vital Signs: BP 125/80 (BP Location: Right Arm)  Pulse (!) 53  Temp 98.3 F (36.8 C) (Oral)  Resp 16  SpO2 99%  CT head without  contrast:As above CTA head and neck:As above Lab data:Sodium 139, potassium 4.0, chloride 105, CO2 26, glucose 95, BUN 19, creatinine 1.13, anion gap 8, LFTs normal, poctroponin negative, white count  5900 and normal differential, hemoglobin 14.1, platelets 197,000, PT 12.9, INR 0.97, PTT 32, alcohol <5 Medications and treatments:None    Subjective:    Bertis Hustead today has been doing well, less dysarthria.     No headache, No chest pain, No abdominal pain - No Nausea, No new weakness tingling or numbness, No Cough - SOB.    Assessment  & Plan :    Principal Problem:   Stroke-like symptoms Active Problems:   HLD (hyperlipidemia)   OA (osteoarthritis)   Parotid mass   Cerebral embolism with cerebral infarction   Stroke (cerebrum) (HCC)   Stroke (R posterolateral frontal lobe acute/early subactue infarct 3.7cc) -Presented with abrupt onset of left facial drooping and dysarthria as well as mild difficulty managing oral secretions  -HgbA1c=5.7, and lipid LDL=144  (07/23/2017) -SLP evaluation:Recommendation is for regular diet with thin liquids -PT =>outpatient PT for balance training -OT => no recommendations Echo=> EF 60-65%,  No cardiac source of emboli TEE pending Cont aspirin, plavix, Lipitor Neurology consulted, appreciate input  8mm lesion in the superior aspect of the left parotid gland  Ultrasound 8/18=> parathyroid cyst  HLD (hyperlipidemia) -States was on simvastatin in the past but quit taking due to knee pain he presumed was related to statin -start Lipitor  OA (osteoarthritis) -Chronic right knee pain that limits mobility but is never been formally diagnosed with osteoarthritis        Code Status : FULL CODE  Family Communication  : w patient  Disposition Plan  : home  Barriers For Discharge :  Awaiting TEE  Consults  :  neurology  Procedures  :  CT brain, CTA brain/neck 8/17=>Negative CTA of the head neck. No emergent large  vessel occlusion, proximal stenosis, or atheromatous changes.  11 mm sclerotic focus in the T3 body is consistent with a bone island in the absence of malignancy history. MRI brain 8/17=> 1. Right posterolateral frontal lobe acute/early subacute infarction, volume 3.7 cc. No acute hemorrhage.  Cardiac echo 8/18=> EF 60-65%  Parotid ultrasound 8/18=> parotid cyst 8mm  DVT Prophylaxis  :  SCDs   Lab Results  Component Value Date   PLT 194 07/24/2017    Antibiotics  :  none  Anti-infectives    None        Objective:   Vitals:   07/23/17 1754 07/23/17 2110 07/24/17 0027 07/24/17 0522  BP: 118/76 122/69 129/60 119/73  Pulse: 68 69 73 77  Resp: 20 20 20 20   Temp: 98.4 F (36.9 C) 98.3 F (36.8 C) 98.5 F (36.9 C) 98.1 F (36.7 C)  TempSrc: Oral Oral Oral Oral  SpO2: 99% 98% 99% 99%  Weight:      Height:        Wt Readings from Last 3 Encounters:  07/22/17 76 kg (167 lb 8.8 oz)     Intake/Output Summary (Last 24 hours) at 07/24/17 0723 Last data filed at 07/23/17 1755  Gross per 24 hour  Intake              950 ml  Output                0 ml  Net              950 ml     Physical Exam  Awake Alert, Oriented X 3, No new F.N deficits, Normal affect [place].AT,PERRAL Supple Neck,No JVD, No cervical lymphadenopathy appriciated.  Symmetrical Chest wall movement, Good air movement bilaterally, CTAB RRR,No Gallops,Rubs or  new Murmurs, No Parasternal Heave +ve B.Sounds, Abd Soft, No tenderness, No organomegaly appriciated, No rebound - guarding or rigidity. No Cyanosis, Clubbing or edema, No new Rash or bruise   No facial droop Speech fluent     Data Review:    CBC  Recent Labs Lab 07/22/17 1200 07/22/17 1207 07/24/17 0353  WBC 5.9  --  7.7  HGB 14.1 15.0 13.5  HCT 41.8 44.0 40.4  PLT 197  --  194  MCV 85.8  --  85.2  MCH 29.0  --  28.5  MCHC 33.7  --  33.4  RDW 14.3  --  14.2  LYMPHSABS 2.0  --   --   MONOABS 0.4  --   --   EOSABS 0.2  --   --     BASOSABS 0.0  --   --     Chemistries   Recent Labs Lab 07/22/17 1200 07/22/17 1207 07/24/17 0353  NA 139 139 137  K 4.0 4.3 3.9  CL 105 104 104  CO2 26  --  25  GLUCOSE 95 92 103*  BUN 19 27* 21*  CREATININE 1.13 1.00 1.17  CALCIUM 9.1  --  9.0  AST 22  --  17  ALT 22  --  17  ALKPHOS 78  --  70  BILITOT 1.1  --  0.9   ------------------------------------------------------------------------------------------------------------------  Recent Labs  07/23/17 0450  CHOL 221*  HDL 57  LDLCALC 144*  TRIG 102  CHOLHDL 3.9    Lab Results  Component Value Date   HGBA1C 5.7 (H) 07/23/2017   ------------------------------------------------------------------------------------------------------------------ No results for input(s): TSH, T4TOTAL, T3FREE, THYROIDAB in the last 72 hours.  Invalid input(s): FREET3 ------------------------------------------------------------------------------------------------------------------ No results for input(s): VITAMINB12, FOLATE, FERRITIN, TIBC, IRON, RETICCTPCT in the last 72 hours.  Coagulation profile  Recent Labs Lab 07/22/17 1200  INR 0.97    No results for input(s): DDIMER in the last 72 hours.  Cardiac Enzymes No results for input(s): CKMB, TROPONINI, MYOGLOBIN in the last 168 hours.  Invalid input(s): CK ------------------------------------------------------------------------------------------------------------------ No results found for: BNP  Inpatient Medications  Scheduled Meds: . aspirin EC  325 mg Oral Daily  . atorvastatin  40 mg Oral q1800  . clopidogrel  75 mg Oral Daily   Continuous Infusions: . sodium chloride Stopped (07/22/17 2045)   PRN Meds:.acetaminophen **OR** acetaminophen (TYLENOL) oral liquid 160 mg/5 mL **OR** acetaminophen, labetalol  Micro Results No results found for this or any previous visit (from the past 240 hour(s)).  Radiology Reports Ct Angio Head W Or Wo Contrast  Result  Date: 07/22/2017 CLINICAL DATA:  Left facial droop and slurred speech EXAM: CT ANGIOGRAPHY HEAD AND NECK TECHNIQUE: Multidetector CT imaging of the head and neck was performed using the standard protocol during bolus administration of intravenous contrast. Multiplanar CT image reconstructions and MIPs were obtained to evaluate the vascular anatomy. Carotid stenosis measurements (when applicable) are obtained utilizing NASCET criteria, using the distal internal carotid diameter as the denominator. CONTRAST:  50 cc Isovue 370 intravenous COMPARISON:  Noncontrast head CT from earlier the same day FINDINGS: CTA NECK FINDINGS Aortic arch: Normal appearance.  Three vessel branching. Right carotid system: Vessels are smooth and widely patent. No noted atheromatous changes. Left carotid system: Vessels are smooth and widely patent. No noted atheromatous changes. Vertebral arteries: No proximal subclavian stenosis. Vertebral arteries are smooth and widely patent. Dominant left vertebral artery. Skeleton: No acute or aggressive finding cervical disc degeneration. There is an 11 mm  sclerotic focus in the right aspect of the L3 body, 11 mm in diameter. Other neck: No noted mass or inflammation. Upper chest: Negative. Review of the MIP images confirms the above findings CTA HEAD FINDINGS Anterior circulation: No branch occlusion, stenosis, or aneurysm. Negative for beading Posterior circulation: Left dominant vertebral artery. Standard vertebrobasilar branching. No branch occlusion or flow limiting stenosis. Negative for beading or aneurysm. Venous sinuses: Widely patent. Anatomic variants: None significant Delayed phase: Not performed in the emergent setting Review of the MIP images confirms the above findings IMPRESSION: Negative CTA of the head neck. No emergent large vessel occlusion, proximal stenosis, or atheromatous changes. 11 mm sclerotic focus in the T3 body is consistent with a bone island in the absence of malignancy  history. Electronically Signed   By: Marnee Spring M.D.   On: 07/22/2017 12:42   Ct Angio Neck W Or Wo Contrast  Result Date: 07/22/2017 CLINICAL DATA:  Left facial droop and slurred speech EXAM: CT ANGIOGRAPHY HEAD AND NECK TECHNIQUE: Multidetector CT imaging of the head and neck was performed using the standard protocol during bolus administration of intravenous contrast. Multiplanar CT image reconstructions and MIPs were obtained to evaluate the vascular anatomy. Carotid stenosis measurements (when applicable) are obtained utilizing NASCET criteria, using the distal internal carotid diameter as the denominator. CONTRAST:  50 cc Isovue 370 intravenous COMPARISON:  Noncontrast head CT from earlier the same day FINDINGS: CTA NECK FINDINGS Aortic arch: Normal appearance.  Three vessel branching. Right carotid system: Vessels are smooth and widely patent. No noted atheromatous changes. Left carotid system: Vessels are smooth and widely patent. No noted atheromatous changes. Vertebral arteries: No proximal subclavian stenosis. Vertebral arteries are smooth and widely patent. Dominant left vertebral artery. Skeleton: No acute or aggressive finding cervical disc degeneration. There is an 11 mm sclerotic focus in the right aspect of the L3 body, 11 mm in diameter. Other neck: No noted mass or inflammation. Upper chest: Negative. Review of the MIP images confirms the above findings CTA HEAD FINDINGS Anterior circulation: No branch occlusion, stenosis, or aneurysm. Negative for beading Posterior circulation: Left dominant vertebral artery. Standard vertebrobasilar branching. No branch occlusion or flow limiting stenosis. Negative for beading or aneurysm. Venous sinuses: Widely patent. Anatomic variants: None significant Delayed phase: Not performed in the emergent setting Review of the MIP images confirms the above findings IMPRESSION: Negative CTA of the head neck. No emergent large vessel occlusion, proximal  stenosis, or atheromatous changes. 11 mm sclerotic focus in the T3 body is consistent with a bone island in the absence of malignancy history. Electronically Signed   By: Marnee Spring M.D.   On: 07/22/2017 12:42   Mr Brain Wo Contrast  Result Date: 07/22/2017 CLINICAL DATA:  61 y/o  M; slurred speech and left facial drooping. EXAM: MRI HEAD WITHOUT CONTRAST TECHNIQUE: Multiplanar, multiecho pulse sequences of the brain and surrounding structures were obtained without intravenous contrast. COMPARISON:  07/22/2017 CT angiogram of the head and CT of the head. FINDINGS: Brain: Right posterolateral frontal lobe reduced diffusion measuring 1.5 x 1.5 x 3.1 cm (volume = 3.7 cm^3) (AP x ML x CC series 3, image 31 and series 4, image 19) compatible with acute/ early subacute infarction. There are a few additional punctate foci of reduced diffusion in the right anterolateral frontal lobe. The infarct demonstrates mild T2 FLAIR hyperintense signal abnormality. No significant mass effect. No evidence for intracranial hemorrhage, hydrocephalus, or herniation. Vascular: Normal flow voids. Skull and upper cervical spine: Normal  marrow signal. Sinuses/Orbits: Negative. Other: Well-circumscribed 8 mm T2 hyperintense, incomplete FLAIR suppression, T1 hypointense focus within superior aspect of the superficial lobe of left parotid gland with soft tissue attenuation on prior CT angiogram (series 6, image 2). IMPRESSION: 1. Right posterolateral frontal lobe acute/early subacute infarction, volume 3.7 cc. No acute hemorrhage. 2. Otherwise unremarkable MRI of the brain. 3. 8 mm lesion within the superior aspect of superficial lobe of left parotid gland with soft tissue attenuation on prior CT angiogram. Differential includes salivary neoplasm and proteinaceous cyst, unlikely lymph node given morphology. This area is likely amenable to ultrasound evaluation to further characterize. Electronically Signed   By: Mitzi Hansen  M.D.   On: 07/22/2017 17:05   US Soft Tissue Head And Neck  Result Date: 07/23/2017 CLINICAL DATA:  Left parotid lesion by MRI EXAM: ULTRASOUND OF HEAD/NECK SOFT TISSUES TECHNIQUE: Ultrasound examination of the head and neck soft tissues was performed in the area of clinical concern. COMPARISON:  07/22/2017 brain MRI FINDINGS: Limited superficial soft tissue ultrasound performed of the left parotid gland. This demonstrates a left parotid intraparenchymal anechoic cyst with increased through transmission. No associated vascularity. Appearance compatible with a parotid cyst. Left parotid cyst measures 8 x 6 x 8 mm. This correlates with the MRI finding. IMPRESSION: 8 mm left parotid cyst. Electronically Signed   By: Judie Petit.  Shick M.D.   On: 07/23/2017 14:02   Ct Head Code Stroke Wo Contrast  Result Date: 07/22/2017 CLINICAL DATA:  Code stroke.  Left facial droop and slurred speech. EXAM: CT HEAD WITHOUT CONTRAST TECHNIQUE: Contiguous axial images were obtained from the base of the skull through the vertex without intravenous contrast. COMPARISON:  None. FINDINGS: Brain: Subtle area of gray-white blurring in the lateral and posterior right frontal lobe, also seen on sagittal reformats. No hemorrhage, hydrocephalus, or atrophy. Vascular: No hyperdense vessel. Minimal atherosclerotic calcification Skull: No acute or aggressive finding. Sinuses/Orbits: Negative Other: These results were called by telephone at the time of interpretation on 07/22/2017 at 12:09 pm to PA [facility] who reports a CT angio is pending. ASPECTS [facility] Stroke Program Early CT Score) - Ganglionic level infarction (caudate, lentiform nuclei, internal capsule, insula, M1-M3 cortex): 7 - Supraganglionic infarction (M4-M6 cortex): 2 Total score (0-10 with 10 being normal): 9 IMPRESSION: 1. Possible small cortical infarct in the posterior right frontal lobe. ASPECTS is 9. 2. No acute hemorrhage. Electronically Signed   By: Marnee Spring M.D.   On:  07/22/2017 12:13    Time Spent in minutes  30   Pearson Grippe M.D on 07/24/2017 at 7:23 AM  Between 7am to 7pm - Pager - [phone]  After 7pm go to [email or web address] - password [email or web address]  [facility] -  Office  [phone]

## 2017-07-24 NOTE — Progress Notes (Signed)
STROKE TEAM PROGRESS NOTE   SUBJECTIVE (INTERVAL HISTORY) No family is at the bedside.  He is sitting in chair. No acute event overnight. Pending TEE and loop recorder tomorrow.   OBJECTIVE Temp:  [98 F (36.7 C)-98.6 F (37 C)] 98.1 F (36.7 C) (08/19 0522) Pulse Rate:  [62-77] 77 (08/19 0522) Cardiac Rhythm: Sinus bradycardia (08/18 2000) Resp:  [20] 20 (08/19 0522) BP: (97-129)/(60-76) 119/73 (08/19 0522) SpO2:  [97 %-99 %] 99 % (08/19 0522)  CBC:   Recent Labs Lab 07/22/17 1200 07/22/17 1207 07/24/17 0353  WBC 5.9  --  7.7  NEUTROABS 3.3  --   --   HGB 14.1 15.0 13.5  HCT 41.8 44.0 40.4  MCV 85.8  --  85.2  PLT 197  --  194    Basic Metabolic Panel:   Recent Labs Lab 07/22/17 1200 07/22/17 1207 07/24/17 0353  NA 139 139 137  K 4.0 4.3 3.9  CL 105 104 104  CO2 26  --  25  GLUCOSE 95 92 103*  BUN 19 27* 21*  CREATININE 1.13 1.00 1.17  CALCIUM 9.1  --  9.0    Lipid Panel:     Component Value Date/Time   CHOL 221 (H) 07/23/2017 0450   TRIG 102 07/23/2017 0450   HDL 57 07/23/2017 0450   CHOLHDL 3.9 07/23/2017 0450   VLDL 20 07/23/2017 0450   LDLCALC 144 (H) 07/23/2017 0450   HgbA1c:  Lab Results  Component Value Date   HGBA1C 5.7 (H) 07/23/2017   Urine Drug Screen:     Component Value Date/Time   LABOPIA NONE DETECTED 07/22/2017 1421   COCAINSCRNUR NONE DETECTED 07/22/2017 1421   LABBENZ NONE DETECTED 07/22/2017 1421   AMPHETMU NONE DETECTED 07/22/2017 1421   THCU NONE DETECTED 07/22/2017 1421   LABBARB NONE DETECTED 07/22/2017 1421    Alcohol Level     Component Value Date/Time   ETH <5 07/22/2017 1200    IMAGING I have personally reviewed the radiological images below and agree with the radiology interpretations.  Ct Angio Head W Or Wo Contrast Ct Angio Neck W Or Wo Contrast 07/22/2017 Negative CTA of the head neck. No emergent large vessel occlusion, proximal stenosis, or atheromatous changes.  11 mm sclerotic focus in the T3  body is consistent with a bone island in the absence of malignancy history.   Mr Brain Wo Contrast 07/22/2017 1. Right posterolateral frontal lobe acute/early subacute infarction, volume 3.7 cc. No acute hemorrhage.  2. Otherwise unremarkable MRI of the brain.  3. 8 mm lesion within the superior aspect of superficial lobe of left parotid gland with soft tissue attenuation on prior CT angiogram. Differential includes salivary neoplasm and proteinaceous cyst, unlikely lymph node given morphology. This area is likely amenable to ultrasound evaluation to further characterize.   Ct Head Code Stroke Wo Contrast 07/22/2017 1. Possible small cortical infarct in the posterior right frontal lobe. ASPECTS is 9.  2. No acute hemorrhage.   TTE - Left ventricle: The cavity size was normal. There was mild focal   basal hypertrophy of the septum. Systolic function was normal.   The estimated ejection fraction was in the range of 60% to 65%.   Wall motion was normal; there were no regional wall motion   abnormalities. Doppler parameters are consistent with abnormal   left ventricular relaxation (grade 1 diastolic dysfunction). - Aortic valve: There was mild regurgitation. Impressions: - No cardiac source of emboli was indentified.  LE venous doppler  There is no obvious evidence of DVT or SVT noted in the bilateral lower extremities.  TEE pending  Neck soft tissue US - 8 mm left parotid cyst.   PHYSICAL EXAM Vitals:   07/23/17 1754 07/23/17 2110 07/24/17 0027 07/24/17 0522  BP: 118/76 122/69 129/60 119/73  Pulse: 68 69 73 77  Resp: 20 20 20 20   Temp: 98.4 F (36.9 C) 98.3 F (36.8 C) 98.5 F (36.9 C) 98.1 F (36.7 C)  TempSrc: Oral Oral Oral Oral  SpO2: 99% 98% 99% 99%  Weight:      Height:        Temp:  [98 F (36.7 C)-98.6 F (37 C)] 98.1 F (36.7 C) (08/19 0522) Pulse Rate:  [62-77] 77 (08/19 0522) Resp:  [20] 20 (08/19 0522) BP: (97-129)/(60-76) 119/73 (08/19 0522) SpO2:  [97  %-99 %] 99 % (08/19 0522)  General - Well nourished, well developed, in no apparent distress.  Ophthalmologic - Sharp disc margins OU.  Cardiovascular - Regular rate and rhythm.  Mental Status -  Level of arousal and orientation to time, place, and person were intact. Language including expression, naming, repetition, comprehension was assessed and found intact. Attention span and concentration were normal. Fund of Knowledge was assessed and was intact.  Cranial Nerves II - XII - II - Visual field intact OU. III, IV, VI - Extraocular movements intact. V - Facial sensation intact bilaterally. VII - mild left facial droop. VIII - Hearing & vestibular intact bilaterally. X - Palate elevates symmetrically. XI - Chin turning & shoulder shrug intact bilaterally. XII - Tongue protrusion intact.  Motor Strength - The patient's strength was normal in all extremities and pronator drift was absent.  Bulk was normal and fasciculations were absent.   Motor Tone - Muscle tone was assessed at the neck and appendages and was normal.  Reflexes - The patient's reflexes were 1+ in all extremities and he had no pathological reflexes.  Sensory - Light touch, temperature/pinprick, vibration and proprioception were assessed and were symmetrical.    Coordination - The patient had normal movements in the hands and feet with no ataxia or dysmetria.  Tremor was absent.  Gait and Station - deferred   ASSESSMENT/PLAN [name] is a 61 y.o. male with no significant past medical history of  presenting with left facial droop, slurred speech and dysphagia. He did not receive IV t-PA due to minimal deficits.  Stroke:  right frontal cortical infarct, right MCA territory - possibly embolic - source unknown.  Resultant  Left mild facial droop  CT head - small cortical infarct in the posterior right frontal lobe.  MRI head - Right posterolateral frontal lobe acute/early subacute infarction  CTA  H&N - unremarkable   2D Echo - unremarkable, EF 60-65%  LE venous doppler no DVT  Due to relative young age and lack of significant risk factors will also do hypercoagulable work up  TEE and loop pending for embolic work up  LDL - 144  HgbA1c - 5.7  UDS neg  VTE prophylaxis - SCDs Diet Heart Room service appropriate? Yes; Fluid consistency: Thin Diet NPO time specified Except for: Sips with Meds  No antithrombotic prior to admission, now on aspirin 325 mg daily and clopidogrel 75 mg daily. Continue DAPT as per POINT trial.   Patient counseled to be compliant with his antithrombotic medications  Ongoing aggressive stroke risk factor management  Therapy recommendations: Outpt PT  Disposition: Pending  Hyperlipidemia  Home meds:  None  LDL 144, goal < 70  Now on Lipitor 80 mg daily  Continue statin at discharge  Other Stroke Risk Factors  Advanced age  Other Active Problems  8 mm left parotid cyst   Hospital day # 1  Marvel Plan, MD PhD Stroke Neurology 07/24/2017 5:32 PM    To contact Stroke Continuity provider, please refer to [email or web address]. After hours, contact General Neurology

## 2017-07-24 NOTE — Progress Notes (Signed)
VASCULAR LAB PRELIMINARY  PRELIMINARY  PRELIMINARY  PRELIMINARY  Bilateral lower extremity venous duplex completed.    Preliminary report:  There is no obvious evidence of DVT or SVT noted in the bilateral lower extremities.   Abie Cheek, RVT 07/24/2017, 11:16 AM

## 2017-07-25 ENCOUNTER — Encounter (HOSPITAL_COMMUNITY): Payer: Self-pay | Admitting: Family Medicine

## 2017-07-25 DIAGNOSIS — R2990 Unspecified symptoms and signs involving the nervous system: Secondary | ICD-10-CM

## 2017-07-25 LAB — BASIC METABOLIC PANEL
Anion gap: 7 (ref 5–15)
BUN: 20 mg/dL (ref 6–20)
CALCIUM: 8.9 mg/dL (ref 8.9–10.3)
CO2: 26 mmol/L (ref 22–32)
CREATININE: 1.06 mg/dL (ref 0.61–1.24)
Chloride: 106 mmol/L (ref 101–111)
Glucose, Bld: 98 mg/dL (ref 65–99)
Potassium: 4.1 mmol/L (ref 3.5–5.1)
SODIUM: 139 mmol/L (ref 135–145)

## 2017-07-25 LAB — ANTI-DNA ANTIBODY, DOUBLE-STRANDED

## 2017-07-25 LAB — HOMOCYSTEINE: Homocysteine: 15.4 umol/L — ABNORMAL HIGH (ref 0.0–15.0)

## 2017-07-25 LAB — ANTINUCLEAR ANTIBODIES, IFA: ANA Ab, IFA: NEGATIVE

## 2017-07-25 NOTE — Progress Notes (Addendum)
STROKE TEAM PROGRESS NOTE   SUBJECTIVE (INTERVAL HISTORY) No family is at the bedside.  No acute event overnight. Pending TEE and loop recorder.     OBJECTIVE Temp:  [97.6 F (36.4 C)-98.3 F (36.8 C)] 97.6 F (36.4 C) (08/20 0940) Pulse Rate:  [63-77] 63 (08/20 0940) Cardiac Rhythm: Other (Comment) (08/20 1427) Resp:  [18-20] 20 (08/20 0940) BP: (109-118)/(62-74) 113/74 (08/20 0940) SpO2:  [98 %-99 %] 99 % (08/20 0940)  CBC:   Recent Labs Lab 07/22/17 1200 07/22/17 1207 07/24/17 0353  WBC 5.9  --  7.7  NEUTROABS 3.3  --   --   HGB 14.1 15.0 13.5  HCT 41.8 44.0 40.4  MCV 85.8  --  85.2  PLT 197  --  194    Basic Metabolic Panel:   Recent Labs Lab 07/24/17 0353 07/25/17 0328  NA 137 139  K 3.9 4.1  CL 104 106  CO2 25 26  GLUCOSE 103* 98  BUN 21* 20  CREATININE 1.17 1.06  CALCIUM 9.0 8.9    Lipid Panel:     Component Value Date/Time   CHOL 221 (H) 07/23/2017 0450   TRIG 102 07/23/2017 0450   HDL 57 07/23/2017 0450   CHOLHDL 3.9 07/23/2017 0450   VLDL 20 07/23/2017 0450   LDLCALC 144 (H) 07/23/2017 0450   HgbA1c:  Lab Results  Component Value Date   HGBA1C 5.7 (H) 07/23/2017   Urine Drug Screen:     Component Value Date/Time   LABOPIA NONE DETECTED 07/22/2017 1421   COCAINSCRNUR NONE DETECTED 07/22/2017 1421   LABBENZ NONE DETECTED 07/22/2017 1421   AMPHETMU NONE DETECTED 07/22/2017 1421   THCU NONE DETECTED 07/22/2017 1421   LABBARB NONE DETECTED 07/22/2017 1421    Alcohol Level     Component Value Date/Time   ETH <5 07/22/2017 1200    IMAGING I have personally reviewed the radiological images below and agree with the radiology interpretations.  Ct Angio Head W Or Wo Contrast Ct Angio Neck W Or Wo Contrast 07/22/2017 Negative CTA of the head neck. No emergent large vessel occlusion, proximal stenosis, or atheromatous changes.  11 mm sclerotic focus in the T3 body is consistent with a bone island in the absence of malignancy history.    Mr Brain Wo Contrast 07/22/2017 1. Right posterolateral frontal lobe acute/early subacute infarction, volume 3.7 cc. No acute hemorrhage.  2. Otherwise unremarkable MRI of the brain.  3. 8 mm lesion within the superior aspect of superficial lobe of left parotid gland with soft tissue attenuation on prior CT angiogram. Differential includes salivary neoplasm and proteinaceous cyst, unlikely lymph node given morphology. This area is likely amenable to ultrasound evaluation to further characterize.   Ct Head Code Stroke Wo Contrast 07/22/2017 1. Possible small cortical infarct in the posterior right frontal lobe. ASPECTS is 9.  2. No acute hemorrhage.   TTE - Left ventricle: The cavity size was normal. There was mild focal   basal hypertrophy of the septum. Systolic function was normal.   The estimated ejection fraction was in the range of 60% to 65%.   Wall motion was normal; there were no regional wall motion   abnormalities. Doppler parameters are consistent with abnormal   left ventricular relaxation (grade 1 diastolic dysfunction). - Aortic valve: There was mild regurgitation. Impressions: - No cardiac source of emboli was indentified.  LE venous doppler There is no obvious evidence of DVT or SVT noted in the bilateral lower extremities.  TEE pending  Neck soft tissue US - 8 mm left parotid cyst.   PHYSICAL EXAM Vitals:   07/24/17 2109 07/25/17 0035 07/25/17 0448 07/25/17 0940  BP: 112/66 118/62 109/68 113/74  Pulse: 77 69 66 63  Resp: 20 20 18 20   Temp: 98 F (36.7 C) 98.3 F (36.8 C) 98 F (36.7 C) 97.6 F (36.4 C)  TempSrc: Oral Oral Oral Oral  SpO2: 98% 98% 99% 99%  Weight:      Height:        Temp:  [97.6 F (36.4 C)-98.3 F (36.8 C)] 97.6 F (36.4 C) (08/20 0940) Pulse Rate:  [63-77] 63 (08/20 0940) Resp:  [18-20] 20 (08/20 0940) BP: (109-118)/(62-74) 113/74 (08/20 0940) SpO2:  [98 %-99 %] 99 % (08/20 0940)  General - Well nourished, well developed, in  no apparent distress.  Ophthalmologic - Sharp disc margins OU.  Cardiovascular - Regular rate and rhythm.  Mental Status -  Level of arousal and orientation to time, place, and person were intact. Language including expression, naming, repetition, comprehension was assessed and found intact. Attention span and concentration were normal. Fund of Knowledge was assessed and was intact.  Cranial Nerves II - XII - II - Visual field intact OU. III, IV, VI - Extraocular movements intact. V - Facial sensation intact bilaterally. VII - mild left facial droop. VIII - Hearing & vestibular intact bilaterally. X - Palate elevates symmetrically. XI - Chin turning & shoulder shrug intact bilaterally. XII - Tongue protrusion intact.  Motor Strength - The patient's strength was normal in all extremities and pronator drift was absent.  Bulk was normal and fasciculations were absent.   Motor Tone - Muscle tone was assessed at the neck and appendages and was normal.  Reflexes - The patient's reflexes were 1+ in all extremities and he had no pathological reflexes.  Sensory - Light touch, temperature/pinprick, vibration and proprioception were assessed and were symmetrical.    Coordination - The patient had normal movements in the hands and feet with no ataxia or dysmetria.  Tremor was absent.  Gait and Station - deferred   ASSESSMENT/PLAN [name] is a 61 y.o. male with no significant past medical history of  presenting with left facial droop, slurred speech and dysphagia. He did not receive IV t-PA due to minimal deficits.  Stroke:  right frontal cortical infarct, right MCA territory - possibly embolic - source unknown.  Resultant  Left mild facial droop  CT head - small cortical infarct in the posterior right frontal lobe.  MRI head - Right posterolateral frontal lobe acute/early subacute infarction  CTA H&N - unremarkable   2D Echo - unremarkable, EF 60-65%  LE venous  doppler no DVT  Due to relative young age and lack of significant risk factors will also do hypercoagulable work up  TEE and loop pending for embolic work up  LDL - 144  HgbA1c - 5.7  UDS neg  VTE prophylaxis - SCDs Diet Heart Room service appropriate? Yes; Fluid consistency: Thin  No antithrombotic prior to admission, now on aspirin 325 mg daily and clopidogrel 75 mg daily. Continue DAPT as per POINT trial.   Patient counseled to be compliant with his antithrombotic medications  Ongoing aggressive stroke risk factor management  Therapy recommendations: Outpt PT  Disposition: Pending  Hyperlipidemia  Home meds:  None  LDL 144, goal < 70  Now on Lipitor 80 mg daily  Continue statin at discharge  Other Stroke Risk Factors  Advanced age  Other  Active Problems  8 mm left parotid cyst   Hospital day # 2  Plan await TEE and loop recorder. Continue present treatment. No new suggestions. Patient is interested in participating in the [facility] stroke prevention trial and will be contacted after discharge for screening for the same 07/25/2017 4:15 PM    To contact Stroke Continuity provider, please refer to [email or web address]. After hours, contact General Neurology

## 2017-07-25 NOTE — Progress Notes (Signed)
PROGRESS NOTE    Luis [name]  GNF:[id number]  DOB: 1956/04/12  DOA: 07/22/2017 PCP: Patient, No Pcp Per   Brief Admission Hx: 61 y.o. male with medical history significant for dyslipidemia for which he stopped taking medications due to reports of knee pain and family history of CVA and MI presented with slurred speech and left facial drooping.     MDM/Assessment & Plan:   Stroke (R posterolateral frontal lobe acute/early subactue infarct 3.7cc) -Presented with abrupt onset of left facial drooping and dysarthria as well as mild difficulty managing oral secretions  -HgbA1c=5.7, and lipid LDL=144  (07/23/2017) -SLP evaluation:Recommendation is for regular diet with thin liquids -PT =>no PT follow up 8/20 -OT => no recommendations Echo=> EF 60-65%,  No cardiac source of emboli TEE pending Cont aspirin, plavix, Lipitor Neurology consulted, appreciate input  8mm lesion in the superior aspect of the left parotidgland  Ultrasound 8/18=> parathyroid cyst  V tach - pt had 66 beat run of wide complex tachycardia, asymptomatic, may need event monitor, recheck electrolytes.   HLD (hyperlipidemia) -States was on simvastatin in the past but quit taking due to knee pain he presumed was related to statin -start Lipitor  OA (osteoarthritis) -Chronic right knee pain that limits mobility but is never been formally diagnosed with osteoarthritis  Code Status : FULL CODE  Family Communication  : w patient  Disposition Plan  : home  Barriers For Discharge :  Awaiting TEE  Consults  :  neurology  Subjective: Pt says he feels great, his speech and face back to normal now. Ambulating well today.  No PT follow up recommended.  Waiting for TEE.   Objective: Vitals:   07/24/17 2109 07/25/17 0035 07/25/17 0448 07/25/17 0940  BP: 112/66 118/62 109/68 113/74  Pulse: 77 69 66 63  Resp: 20 20 18 20   Temp: 98 F (36.7 C) 98.3 F (36.8 C) 98 F (36.7 C) 97.6 F (36.4 C)    TempSrc: Oral Oral Oral Oral  SpO2: 98% 98% 99% 99%  Weight:      Height:        Intake/Output Summary (Last 24 hours) at 07/25/17 1434 Last data filed at 07/24/17 1852  Gross per 24 hour  Intake              240 ml  Output                0 ml  Net              240 ml   Filed Weights   07/22/17 1954  Weight: 76 kg (167 lb 8.8 oz)     REVIEW OF SYSTEMS  As per history otherwise all reviewed and reported negative  Exam:  General exam: awake, alert, NAD Respiratory system: Clear. No increased work of breathing. Cardiovascular system: S1 & S2 heard, RRR. No JVD, murmurs, gallops, clicks or pedal edema. Gastrointestinal system: Abdomen is nondistended, soft and nontender. Normal bowel sounds heard. Central nervous system: Alert and oriented. No focal neurological deficits. Extremities: no CCE.  Data Reviewed: Basic Metabolic Panel:  Recent Labs Lab 07/22/17 1200 07/22/17 1207 07/24/17 0353 07/25/17 0328  NA 139 139 137 139  K 4.0 4.3 3.9 4.1  CL 105 104 104 106  CO2 26  --  25 26  GLUCOSE 95 92 103* 98  BUN 19 27* 21* 20  CREATININE 1.13 1.00 1.17 1.06  CALCIUM 9.1  --  9.0 8.9   Liver Function Tests:  Recent  Labs Lab 07/22/17 1200 07/24/17 0353  AST 22 17  ALT 22 17  ALKPHOS 78 70  BILITOT 1.1 0.9  PROT 6.5 5.8*  ALBUMIN 3.8 3.2*   No results for input(s): LIPASE, AMYLASE in the last 168 hours. No results for input(s): AMMONIA in the last 168 hours. CBC:  Recent Labs Lab 07/22/17 1200 07/22/17 1207 07/24/17 0353  WBC 5.9  --  7.7  NEUTROABS 3.3  --   --   HGB 14.1 15.0 13.5  HCT 41.8 44.0 40.4  MCV 85.8  --  85.2  PLT 197  --  194   Cardiac Enzymes: No results for input(s): CKTOTAL, CKMB, CKMBINDEX, TROPONINI in the last 168 hours. CBG (last 3)  No results for input(s): GLUCAP in the last 72 hours. No results found for this or any previous visit (from the past 240 hour(s)).   Studies: No results found.   Scheduled Meds: . aspirin  EC  325 mg Oral Daily  . atorvastatin  40 mg Oral q1800  . clopidogrel  75 mg Oral Daily   Continuous Infusions: . sodium chloride Stopped (07/22/17 2045)    Principal Problem:   Stroke-like symptoms Active Problems:   HLD (hyperlipidemia)   OA (osteoarthritis)   Parotid cyst   Cerebral embolism with cerebral infarction   Stroke (cerebrum) (HCC)  Time spent:   Standley Dakins, MD, FAAFP [facility] Pager [phone]  If 7PM-7AM, please contact night-coverage [email or web address] Password [email or web address] 07/25/2017, 2:34 PM    LOS: 2 days

## 2017-07-25 NOTE — Progress Notes (Signed)
Physical Therapy Discharge Patient Details Name: Luis [name] MRN: [id number] DOB: March 28, 1956 Today's Date: 07/25/2017 Time: 3500-9381 PT Time Calculation (min) (ACUTE ONLY): 22 min  Patient discharged from PT services secondary to goals met and no further PT needs identified.  Please see latest therapy progress note for current level of functioning and progress toward goals.    Progress and discharge plan discussed with patient and/or caregiver: Patient/Caregiver agrees with plan  GP Functional Assessment Tool Used: AM-PAC 6 Clicks Basic Mobility Functional Limitation: Mobility: Walking and moving around Mobility: Walking and Moving Around Current Status (W2993): At least 1 percent but less than 20 percent impaired, limited or restricted Mobility: Walking and Moving Around Goal Status [phone]): At least 1 percent but less than 20 percent impaired, limited or restricted Mobility: Walking and Moving Around Discharge Status [phone]): At least 1 percent but less than 20 percent impaired, limited or restricted   Micheal Murad J Dynasti Kerman Rahmon Heigl, PT DPT  Board Certified Neurologic Specialist [phone]  07/25/2017, 9:38 AM

## 2017-07-25 NOTE — Progress Notes (Signed)
    [facility] has been requested to perform a transesophageal echocardiogram on Penni Bombard for CVA.  After careful review of history and examination, the risks and benefits of transesophageal echocardiogram have been explained including risks of esophageal damage, perforation (1:10,000 risk), bleeding, pharyngeal hematoma as well as other potential complications associated with conscious sedation including aspiration, arrhythmia, respiratory failure and death. Alternatives to treatment were discussed, questions were answered. Patient is willing to proceed.   Nada Boozer, NP  07/25/2017 5:10 PM

## 2017-07-25 NOTE — Progress Notes (Signed)
Physical Therapy Treatment Patient Details Name: Luis [name] MRN: [id number] DOB: 06/09/1956 Today's Date: 07/25/2017    History of Present Illness Luis [name] is an 61 y.o. male without any known medical conditions presented to [facility] with complaints of left facial droop, slurred speech and dysphagia.  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     PMH:  HLD    PT Comments    Patient doing remarkably better today. All goals met, Independent with activity. No further acute needs. Will sign off.    Follow Up Recommendations  No PT follow up     Equipment Recommendations  None recommended by PT    Recommendations for Other Services       Precautions / Restrictions Restrictions Weight Bearing Restrictions: No    Mobility  Bed Mobility Overal bed mobility: Independent             General bed mobility comments: Pt OOB in chair upon arrival  Transfers Overall transfer level: Independent                  Ambulation/Gait Ambulation/Gait assistance: Independent Ambulation Distance (Feet): 1200 Feet Assistive device: None Gait Pattern/deviations: WFL(Within Functional Limits)     General Gait Details: no instability or LOB on level ground without distractions, min guard for safety   Stairs     Stair Management: No rails Number of Stairs: 12 General stair comments: no difficulty performing, no rails, recipricol negotation  Wheelchair Mobility    Modified Rankin (Stroke Patients Only) Modified Rankin (Stroke Patients Only) Pre-Morbid Rankin Score: No symptoms Modified Rankin: Slight disability     Balance Overall balance assessment: Needs assistance Sitting-balance support: Feet supported;No upper extremity supported Sitting balance-Leahy Scale: Normal     Standing balance support: No upper extremity supported;During functional activity Standing balance-Leahy Scale: Good Standing balance comment: Good balance on  level surfaces.  Difficulty with high level balance activities.             High level balance activites: Side stepping;Backward walking;Direction changes;Turns;Sudden stops;Head turns High Level Balance Comments: improved speed, good control, no over LOB or instability noted Standardized Balance Assessment Standardized Balance Assessment : Dynamic Gait Index   Dynamic Gait Index Level Surface: Normal Change in Gait Speed: Normal Gait with Horizontal Head Turns: Normal Gait with Vertical Head Turns: Normal Gait and Pivot Turn: Normal Step Over Obstacle: Normal Step Around Obstacles: Normal Steps: Normal Total Score: 24      Cognition Arousal/Alertness: Awake/alert Behavior During Therapy: WFL for tasks assessed/performed Overall Cognitive Status: Within Functional Limits for tasks assessed                                        Exercises      General Comments General comments (skin integrity, edema, etc.): 3      Pertinent Vitals/Pain Pain Assessment: No/denies pain    Home Living                      Prior Function            PT Goals (current goals can now be found in the care plan section) Acute Rehab PT Goals Patient Stated Goal: To get back to normal PT Goal Formulation: With patient Time For Goal Achievement: 07/29/17 Potential to Achieve Goals: Good Progress towards PT goals: Goals met/education completed, patient discharged from PT    Frequency  Min 4X/week      PT Plan Discharge plan needs to be updated (discharge from PT)    Co-evaluation              AM-PAC PT "6 Clicks" Daily Activity  Outcome Measure  Difficulty turning over in bed (including adjusting bedclothes, sheets and blankets)?: None Difficulty moving from lying on back to sitting on the side of the bed? : None Difficulty sitting down on and standing up from a chair with arms (e.g., wheelchair, bedside commode, etc,.)?: None Help needed  moving to and from a bed to chair (including a wheelchair)?: None Help needed walking in hospital room?: A Little Help needed climbing 3-5 steps with a railing? : A Little 6 Click Score: 22    End of Session Equipment Utilized During Treatment: Gait belt Activity Tolerance: Patient tolerated treatment well Patient left: in chair;with call bell/phone within reach;with chair alarm set Nurse Communication: Mobility status PT Visit Diagnosis: Unsteadiness on feet (R26.81);Other abnormalities of gait and mobility (R26.89)     Time: 0174-9449 PT Time Calculation (min) (ACUTE ONLY): 22 min  Charges:  $Gait Training: 8-22 mins                    G Codes:  Functional Assessment Tool Used: AM-PAC 6 Clicks Basic Mobility Functional Limitation: Mobility: Walking and moving around Mobility: Walking and Moving Around Current Status (Q7591): At least 1 percent but less than 20 percent impaired, limited or restricted Mobility: Walking and Moving Around Goal Status [phone]): At least 1 percent but less than 20 percent impaired, limited or restricted Mobility: Walking and Moving Around Discharge Status [phone]): At least 1 percent but less than 20 percent impaired, limited or restricted    Alben Deeds, PT DPT  Board Certified Neurologic Specialist [place] 07/25/2017, 9:22 AM

## 2017-07-26 ENCOUNTER — Inpatient Hospital Stay (HOSPITAL_COMMUNITY): Payer: 59

## 2017-07-26 ENCOUNTER — Encounter (HOSPITAL_COMMUNITY): Payer: Self-pay | Admitting: Physician Assistant

## 2017-07-26 ENCOUNTER — Encounter (HOSPITAL_COMMUNITY)
Admission: EM | Disposition: A | Discharge status: Home / Self Care | Payer: Self-pay | Source: Home / Self Care | Attending: Internal Medicine

## 2017-07-26 DIAGNOSIS — Q2112 Patent foramen ovale: Secondary | ICD-10-CM

## 2017-07-26 DIAGNOSIS — Q211 Atrial septal defect: Secondary | ICD-10-CM

## 2017-07-26 DIAGNOSIS — I638 Other cerebral infarction: Secondary | ICD-10-CM

## 2017-07-26 DIAGNOSIS — I639 Cerebral infarction, unspecified: Secondary | ICD-10-CM

## 2017-07-26 HISTORY — PX: TEE WITHOUT CARDIOVERSION: SHX5443

## 2017-07-26 HISTORY — PX: LOOP RECORDER INSERTION: EP1214

## 2017-07-26 LAB — CBC
HCT: 41.4 % (ref 39.0–52.0)
Hemoglobin: 13.8 g/dL (ref 13.0–17.0)
MCH: 28.6 pg (ref 26.0–34.0)
MCHC: 33.3 g/dL (ref 30.0–36.0)
MCV: 85.7 fL (ref 78.0–100.0)
PLATELETS: 196 10*3/uL (ref 150–400)
RBC: 4.83 MIL/uL (ref 4.22–5.81)
RDW: 14.1 % (ref 11.5–15.5)
WBC: 6.4 10*3/uL (ref 4.0–10.5)

## 2017-07-26 LAB — COMPREHENSIVE METABOLIC PANEL
ALK PHOS: 70 U/L (ref 38–126)
ALT: 23 U/L (ref 17–63)
AST: 24 U/L (ref 15–41)
Albumin: 3.4 g/dL — ABNORMAL LOW (ref 3.5–5.0)
Anion gap: 7 (ref 5–15)
BUN: 21 mg/dL — AB (ref 6–20)
CHLORIDE: 106 mmol/L (ref 101–111)
CO2: 27 mmol/L (ref 22–32)
CREATININE: 1 mg/dL (ref 0.61–1.24)
Calcium: 9.1 mg/dL (ref 8.9–10.3)
GFR calc Af Amer: >60 mL/min (ref >60)
Glucose, Bld: 95 mg/dL (ref 65–99)
Potassium: 4.4 mmol/L (ref 3.5–5.1)
Sodium: 140 mmol/L (ref 135–145)
Total Bilirubin: 1 mg/dL (ref 0.3–1.2)
Total Protein: 6 g/dL — ABNORMAL LOW (ref 6.5–8.1)

## 2017-07-26 LAB — CARDIOLIPIN ANTIBODIES, IGG, IGM, IGA
Anticardiolipin IgA: <9 APL U/mL (ref 0–11)
Anticardiolipin IgM: <9 MPL U/mL (ref 0–12)

## 2017-07-26 LAB — BETA-2-GLYCOPROTEIN I ABS, IGG/M/A

## 2017-07-26 LAB — PHOSPHORUS: PHOSPHORUS: 3.7 mg/dL (ref 2.5–4.6)

## 2017-07-26 LAB — MAGNESIUM: Magnesium: 2 mg/dL (ref 1.7–2.4)

## 2017-07-26 LAB — BASIC METABOLIC PANEL
Anion gap: 6 (ref 5–15)
BUN: 18 mg/dL (ref 6–20)
CHLORIDE: 104 mmol/L (ref 101–111)
CO2: 27 mmol/L (ref 22–32)
CREATININE: 1.03 mg/dL (ref 0.61–1.24)
Calcium: 9.1 mg/dL (ref 8.9–10.3)
GFR calc Af Amer: >60 mL/min (ref >60)
GFR calc non Af Amer: >60 mL/min (ref >60)
GLUCOSE: 151 mg/dL — AB (ref 65–99)
POTASSIUM: 4 mmol/L (ref 3.5–5.1)
Sodium: 137 mmol/L (ref 135–145)

## 2017-07-26 LAB — PROTIME-INR
INR: 1.04
Prothrombin Time: 13.6 seconds (ref 11.4–15.2)

## 2017-07-26 LAB — HIV ANTIBODY (ROUTINE TESTING W REFLEX): HIV Screen 4th Generation wRfx: NONREACTIVE

## 2017-07-26 SURGERY — ECHOCARDIOGRAM, TRANSESOPHAGEAL
Anesthesia: Moderate Sedation

## 2017-07-26 SURGERY — LOOP RECORDER INSERTION

## 2017-07-26 MED ORDER — LIDOCAINE-EPINEPHRINE 1 %-1:100000 IJ SOLN
INTRAMUSCULAR | Status: DC | PRN
  Administered 2017-07-26: 10 mL

## 2017-07-26 MED ORDER — LIDOCAINE-EPINEPHRINE 1 %-1:100000 IJ SOLN
INTRAMUSCULAR | Status: AC
  Filled 2017-07-26: qty 1

## 2017-07-26 MED ORDER — MIDAZOLAM HCL 5 MG/ML IJ SOLN
INTRAMUSCULAR | Status: AC
  Filled 2017-07-26: qty 2

## 2017-07-26 MED ORDER — SODIUM CHLORIDE 0.9 % IV SOLN: INTRAVENOUS | Status: DC

## 2017-07-26 MED ORDER — CLOPIDOGREL BISULFATE 75 MG PO TABS: 75.0000 mg | ORAL_TABLET | Freq: Every day | ORAL | 0 refills | Status: AC

## 2017-07-26 MED ORDER — FENTANYL CITRATE (PF) 100 MCG/2ML IJ SOLN
INTRAMUSCULAR | Status: DC | PRN
  Administered 2017-07-26 (×2): 25 ug via INTRAVENOUS

## 2017-07-26 MED ORDER — FENTANYL CITRATE (PF) 100 MCG/2ML IJ SOLN
INTRAMUSCULAR | Status: AC
  Filled 2017-07-26: qty 2

## 2017-07-26 MED ORDER — SODIUM CHLORIDE 0.9 % IV SOLN
INTRAVENOUS | Status: AC | PRN
  Administered 2017-07-26: 500 mL via INTRAVENOUS

## 2017-07-26 MED ORDER — ATORVASTATIN CALCIUM 80 MG PO TABS: 80.0000 mg | ORAL_TABLET | Freq: Every day | ORAL | 0 refills | Status: DC

## 2017-07-26 MED ORDER — ASPIRIN 325 MG PO TBEC: 325.0000 mg | DELAYED_RELEASE_TABLET | Freq: Every day | ORAL | 0 refills | Status: AC

## 2017-07-26 MED ORDER — MIDAZOLAM HCL 10 MG/2ML IJ SOLN
INTRAMUSCULAR | Status: DC | PRN
Start: 2017-07-26 — End: 2017-07-26
  Administered 2017-07-26 (×2): 2 mg via INTRAVENOUS

## 2017-07-26 MED ORDER — BUTAMBEN-TETRACAINE-BENZOCAINE 2-2-14 % EX AERO
INHALATION_SPRAY | CUTANEOUS | Status: DC | PRN
Start: 2017-07-26 — End: 2017-07-26
  Administered 2017-07-26: 2 via TOPICAL

## 2017-07-26 SURGICAL SUPPLY — 2 items
LOOP REVEAL LINQSYS (Prosthesis & Implant Heart) ×2 IMPLANT
PACK LOOP INSERTION (CUSTOM PROCEDURE TRAY) ×2 IMPLANT

## 2017-07-26 NOTE — Progress Notes (Signed)
STROKE TEAM PROGRESS NOTE   SUBJECTIVE (INTERVAL HISTORY) His family is at the bedside.  No acute event overnight. TEE Showed no clot but did show a patent foramen ovale. Lower extremity venous Doppler yesterday showed no evidence of DVT   OBJECTIVE Temp:  [97.8 F (36.6 C)-99.4 F (37.4 C)] 97.9 F (36.6 C) (08/21 0947) Pulse Rate:  [50-71] 58 (08/21 0947) Cardiac Rhythm: Sinus bradycardia (08/21 0840) Resp:  [12-20] 18 (08/21 0947) BP: (92-147)/(57-79) 105/73 (08/21 0947) SpO2:  [97 %-100 %] 98 % (08/21 0947) Weight:  [167 lb (75.8 kg)] 167 lb (75.8 kg) (08/21 0741)  CBC:   Recent Labs Lab 07/22/17 1200  07/24/17 0353 07/26/17 0356  WBC 5.9  --  7.7 6.4  NEUTROABS 3.3  --   --   --   HGB 14.1  < > 13.5 13.8  HCT 41.8  < > 40.4 41.4  MCV 85.8  --  85.2 85.7  PLT 197  --  194 196  < > = values in this interval not displayed.  Basic Metabolic Panel:   Recent Labs Lab 07/26/17 0356 07/26/17 1152  NA 140 137  K 4.4 4.0  CL 106 104  CO2 27 27  GLUCOSE 95 151*  BUN 21* 18  CREATININE 1.00 1.03  CALCIUM 9.1 9.1  MG 2.0  --   PHOS 3.7  --     Lipid Panel:     Component Value Date/Time   CHOL 221 (H) 07/23/2017 0450   TRIG 102 07/23/2017 0450   HDL 57 07/23/2017 0450   CHOLHDL 3.9 07/23/2017 0450   VLDL 20 07/23/2017 0450   LDLCALC 144 (H) 07/23/2017 0450   HgbA1c:  Lab Results  Component Value Date   HGBA1C 5.7 (H) 07/23/2017   Urine Drug Screen:     Component Value Date/Time   LABOPIA NONE DETECTED 07/22/2017 1421   COCAINSCRNUR NONE DETECTED 07/22/2017 1421   LABBENZ NONE DETECTED 07/22/2017 1421   AMPHETMU NONE DETECTED 07/22/2017 1421   THCU NONE DETECTED 07/22/2017 1421   LABBARB NONE DETECTED 07/22/2017 1421    Alcohol Level     Component Value Date/Time   ETH <5 07/22/2017 1200    IMAGING I have personally reviewed the radiological images below and agree with the radiology interpretations.  Ct Angio Head W Or Wo Contrast Ct Angio  Neck W Or Wo Contrast 07/22/2017 Negative CTA of the head neck. No emergent large vessel occlusion, proximal stenosis, or atheromatous changes.  11 mm sclerotic focus in the T3 body is consistent with a bone island in the absence of malignancy history.   Mr Brain Wo Contrast 07/22/2017 1. Right posterolateral frontal lobe acute/early subacute infarction, volume 3.7 cc. No acute hemorrhage.  2. Otherwise unremarkable MRI of the brain.  3. 8 mm lesion within the superior aspect of superficial lobe of left parotid gland with soft tissue attenuation on prior CT angiogram. Differential includes salivary neoplasm and proteinaceous cyst, unlikely lymph node given morphology. This area is likely amenable to ultrasound evaluation to further characterize.   Ct Head Code Stroke Wo Contrast 07/22/2017 1. Possible small cortical infarct in the posterior right frontal lobe. ASPECTS is 9.  2. No acute hemorrhage.   TTE - Left ventricle: The cavity size was normal. There was mild focal   basal hypertrophy of the septum. Systolic function was normal.   The estimated ejection fraction was in the range of 60% to 65%.   Wall motion was normal; there were no  regional wall motion   abnormalities. Doppler parameters are consistent with abnormal   left ventricular relaxation (grade 1 diastolic dysfunction). - Aortic valve: There was mild regurgitation. Impressions: - No cardiac source of emboli was indentified.  LE venous doppler There is no obvious evidence of DVT or SVT noted in the bilateral lower extremities.  TEE no clot. PFO present  Neck soft tissue US - 8 mm left parotid cyst.   PHYSICAL EXAM Vitals:   07/26/17 0840 07/26/17 0847 07/26/17 0855 07/26/17 0947  BP:  106/78 (!) 107/59 105/73  Pulse:  63  (!) 58  Resp:  17  18  Temp:  97.8 F (36.6 C)  97.9 F (36.6 C)  TempSrc:  Oral  Oral  SpO2: 100% 97%  98%  Weight:      Height:        Temp:  [97.8 F (36.6 C)-99.4 F (37.4 C)] 97.9 F  (36.6 C) (08/21 0947) Pulse Rate:  [50-71] 58 (08/21 0947) Resp:  [12-20] 18 (08/21 0947) BP: (92-147)/(57-79) 105/73 (08/21 0947) SpO2:  [97 %-100 %] 98 % (08/21 0947) Weight:  [167 lb (75.8 kg)] 167 lb (75.8 kg) (08/21 0741)  General - Well nourished, well developed, in no apparent distress.  Ophthalmologic - Sharp disc margins OU.  Cardiovascular - Regular rate and rhythm.  Mental Status -  Level of arousal and orientation to time, place, and person were intact. Language including expression, naming, repetition, comprehension was assessed and found intact. Attention span and concentration were normal. Fund of Knowledge was assessed and was intact.  Cranial Nerves II - XII - II - Visual field intact OU. III, IV, VI - Extraocular movements intact. V - Facial sensation intact bilaterally. VII - mild left facial droop. VIII - Hearing & vestibular intact bilaterally. X - Palate elevates symmetrically. XI - Chin turning & shoulder shrug intact bilaterally. XII - Tongue protrusion intact.  Motor Strength - The patient's strength was normal in all extremities and pronator drift was absent.  Bulk was normal and fasciculations were absent.   Motor Tone - Muscle tone was assessed at the neck and appendages and was normal.  Reflexes - The patient's reflexes were 1+ in all extremities    Sensory - Light touch, temperature/pinprick, vibration and proprioception were assessed and were symmetrical.    Coordination - The patient had normal movements in the hands and feet with no ataxia or dysmetria.  Tremor was absent.  Gait and Station - deferred   ASSESSMENT/PLAN Luis [name] is a 61 y.o. male with no significant past medical history of  presenting with left facial droop, slurred speech and dysphagia. He did not receive IV t-PA due to minimal deficits.  Stroke:  right frontal cortical infarct, right MCA territory - possibly embolic - source unknown.  Resultant  Left mild  facial droop  CT head - small cortical infarct in the posterior right frontal lobe.  MRI head - Right posterolateral frontal lobe acute/early subacute infarction  CTA H&N - unremarkable   2D Echo - unremarkable, EF 60-65%  LE venous doppler no DVT  Due to relative young age and lack of significant risk factors will also do hypercoagulable work up  TEE and loop pending for embolic work up  LDL - 144  HgbA1c - 5.7  UDS neg  VTE prophylaxis - SCDs Diet Heart Room service appropriate? Yes; Fluid consistency: Thin  No antithrombotic prior to admission, now on aspirin 325 mg daily and clopidogrel 75 mg daily.  Continue DAPT as per POINT trial.   Patient counseled to be compliant with his antithrombotic medications  Ongoing aggressive stroke risk factor management  Therapy recommendations: Outpt PT  Disposition: Pending  Hyperlipidemia  Home meds:  None  LDL 144, goal < 70  Now on Lipitor 80 mg daily  Continue statin at discharge  Other Stroke Risk Factors  Advanced age  Other Active Problems  8 mm left parotid cyst   Hospital day # 3  Plan await   loop recorder. Continue present treatment. No new suggestions. Patient is interested in participating in the [facility] stroke prevention trial and will be contacted after discharge for screening for the same. Follow-up as an outpatient in stroke clinic in 6 weeks. Stroke team will sign off. Kindly call for questions. 07/26/2017 1:23 PM    To contact Stroke Continuity provider, please refer to [email or web address]. After hours, contact General Neurology

## 2017-07-26 NOTE — Discharge Summary (Signed)
Physician Discharge Summary  Luis [name] WUJ:[id number] DOB: [birth year] DOA: 07/22/2017  PCP: Doreene Eland, MD  Admit date: 07/22/2017 Discharge date: 07/26/2017  Admitted From: HOME  Disposition: HOME   Recommendations for Outpatient Follow-up:  1. Follow up with PCP in 1 weeks 2. Follow up with neurology in 6 weeks 3. Follow up with cardiologist on 8/29 as scheduled  Discharge Condition: STABLE  CODE STATUS: CODE  Brief Hospitalization Summary: Please see all hospital notes, images, labs for full details of the hospitalization.  HPI: Luis [name] is a 61 y.o. male with medical history significant for dyslipidemia for which he stopped taking medications due to reports of knee pain and family history of CVA and MI. Patient reports that he initially awakened around 6:00 this morning and was in his usual state of health. Went back to sleep and got up at 9:30 AM to go eat breakfast. While eating breakfast he noticed he was having difficulty drinking his coffee and swallowing his foods. His wife noted left side facial drooping and he was aware that he was having difficulty speaking and that he "sounded drunk". He presented to the ER but due to mild symptomatology was not a candidate for TPA although code stroke was initiated and he was evaluated by neurology. Initial CT of the head demonstrated a possible small cortical infarct in the posterior right frontal lobe without acute hemorrhage. CTA head and neck were negative and no large vessel issues determined. He was also an incidental finding of an 11 mm sclerotic focus in the T3 body consistent with a bone island in the absence of a malignancy history.  ED Course:  Vital Signs: BP 125/80 (BP Location: Right Arm)   Pulse (!) 53   Temp 98.3 F (36.8 C) (Oral)   Resp 16   SpO2 99%  CT head without contrast: As above CTA head and neck: As above Lab data: Sodium 139, potassium 4.0, chloride 105, CO2 26, glucose 95, BUN 19,  creatinine 1.13, anion gap 8, LFTs normal, poc troponin negative, white count 5900 and normal differential, hemoglobin 14.1, platelets 197,000, PT 12.9, INR 0.97, PTT 32, alcohol <5 Medications and treatments: None Stroke (R posterolateral frontal lobe acute/early subactue infarct 3.7cc) -Presentedwith abrupt onset of left facial drooping and dysarthria as well as mild difficulty managing oral secretions  -HgbA1c=5.7, and lipid LDL=144 (07/23/2017) -SLP evaluation:Recommendation is for regular diet with thin liquids -PT =>no PT follow up 8/20 -OT => no recommendations Echo=>EF 60-65%, No cardiac source of emboli TEE: reveals PFO with right to left shunting Cont aspirin, plavix, Lipitor Neurology consulted, appreciate input  Ct Angio Head W Or Wo Contrast Ct Angio Neck W Or Wo Contrast 07/22/2017 Negative CTA of the head neck. No emergent large vessel occlusion, proximal stenosis, or atheromatous changes.  11 mm sclerotic focus in the T3 body is consistent with a bone island in the absence of malignancy history.   Mr Brain Wo Contrast 07/22/2017 1. Right posterolateral frontal lobe acute/early subacute infarction, volume 3.7 cc. No acute hemorrhage.  2. Otherwise unremarkable MRI of the brain.  3. 8 mm lesion within the superior aspect of superficial lobe of left parotid gland with soft tissue attenuation on prior CT angiogram. Differential includes salivary neoplasm and proteinaceous cyst, unlikely lymph node given morphology. This area is likely amenable to ultrasound evaluation to further characterize.   Ct Head Code Stroke Wo Contrast 07/22/2017 1. Possible small cortical infarct in the posterior right frontal lobe. ASPECTS is 9.  2.  No acute hemorrhage.   TTE - Left ventricle: The cavity size was normal. There was mild focal basal hypertrophy of the septum. Systolic function was normal. The estimated ejection fraction was in the range of 60% to 65%. Wall motion was  normal; there were no regional wall motion abnormalities. Doppler parameters are consistent with abnormal left ventricular relaxation (grade 1 diastolic dysfunction). - Aortic valve: There was mild regurgitation. Impressions: - No cardiac source of emboli was indentified.  LE venous doppler There is no obvious evidence of DVT or SVT noted in the bilateral lower extremities.  TEE no clot. PFO present  Stroke:  right frontal cortical infarct, right MCA territory - possibly embolic - source unknown.  Resultant  Left mild facial droop  CT head - small cortical infarct in the posterior right frontal lobe.  MRI head - Right posterolateral frontal lobe acute/early subacute infarction  CTA H&N - unremarkable   2D Echo - unremarkable, EF 60-65%  LE venous doppler no DVT  Due to relative young age and lack of significant risk factors will also do hypercoagulable work up  TEE and loop pending for embolic work up  LDL - 144  HgbA1c - 5.7  UDS neg  VTE prophylaxis - SCDs  Diet Heart Room service appropriate? Yes; Fluid consistency: Thin  No antithrombotic prior to admission, now on aspirin 325 mg daily and clopidogrel 75 mg daily. Continue DAPT as per POINT trial.   Patient counseled to be compliant with his antithrombotic medications  Ongoing aggressive stroke risk factor management  Therapy recommendations: Outpt PT  Disposition: Pending  Hyperlipidemia  Home meds:  None  LDL 144, goal < 70  Now on Lipitor 80 mg daily  Continue statin at discharge  Other Stroke Risk Factors  Advanced age  Other Active Problems  8 mm left parotid cyst   Hospital day # 3  Plan await   loop recorder. Continue present treatment. No new suggestions. Patient is interested in participating in the [facility] stroke prevention trial and will be contacted after discharge for screening for the same. Follow-up as an outpatient in stroke clinic in 6 weeks. Stroke team will sign  off. Kindly call for questions.  8mm lesion in the superior aspect of the left parotidgland  Ultrasound 8/18=> parathyroid cyst   HLD (hyperlipidemia) -States was on simvastatin in the past but quit taking due to knee pain he presumed was related to statin -start Lipitor  OA (osteoarthritis) -Chronic right knee pain that limits mobility but is never been formally diagnosed with osteoarthritis  Code Status:FULL CODE  Family Communication :w patient  Disposition Plan:home  Discharge Diagnoses:  Principal Problem:   Stroke-like symptoms Active Problems:   HLD (hyperlipidemia)   OA (osteoarthritis)   Parotid cyst   Cerebral embolism with cerebral infarction   Stroke (cerebrum) (HCC)   PFO (patent foramen ovale)  Discharge Instructions: Discharge Instructions    Call MD for:  difficulty breathing, headache or visual disturbances    Complete by:  As directed    Call MD for:  extreme fatigue    Complete by:  As directed    Call MD for:  hives    Complete by:  As directed    Call MD for:  persistant dizziness or light-headedness    Complete by:  As directed    Call MD for:  persistant nausea and vomiting    Complete by:  As directed    Call MD for:  severe uncontrolled pain  Complete by:  As directed    Call MD for:  temperature >100.4    Complete by:  As directed    Diet - low sodium heart healthy    Complete by:  As directed    Increase activity slowly    Complete by:  As directed      Allergies as of 07/26/2017      Reactions   Flexeril [cyclobenzaprine] Nausea Only      Medication List    STOP taking these medications   naproxen sodium 220 MG tablet Commonly known as:  ANAPROX     TAKE these medications   aspirin 325 MG EC tablet Take 1 tablet (325 mg total) by mouth daily.   atorvastatin 80 MG tablet Commonly known as:  LIPITOR Take 1 tablet (80 mg total) by mouth daily at 6 PM.   B COMPLEX 1 PO Take 1 tablet by mouth daily.    clopidogrel 75 MG tablet Commonly known as:  PLAVIX Take 1 tablet (75 mg total) by mouth daily.   ibuprofen 200 MG tablet Commonly known as:  ADVIL,MOTRIN Take 400 mg by mouth every 6 (six) hours as needed for headache or mild pain.   LUTEIN PO Take 1 tablet by mouth daily.   multivitamin with minerals Tabs tablet Take 1 tablet by mouth daily.   VITAMIN C PO Take 1 tablet by mouth daily.   VITAMIN E PO Take 1 capsule by mouth daily.      Follow-up Information    [facility] [address] [facility] Follow up on 08/03/2017.   Specialty:  Cardiology Why:  at [facility] information: [address], [address] [place] [address] [phone]       Micki Riley, MD. Schedule an appointment as soon as possible for a visit in 6 week(s).   Specialties:  Neurology, Radiology Contact information: [address] [place] [address] [phone]        Doreene Eland, MD. Schedule an appointment as soon as possible for a visit in 1 week(s).   Specialty:  Family Medicine Contact information: [address] [place] [address] [phone]          Allergies  Allergen Reactions  . Flexeril [Cyclobenzaprine] Nausea Only   Current Discharge Medication List    START taking these medications   Details  aspirin EC 325 MG EC tablet Take 1 tablet (325 mg total) by mouth daily. Qty: 30 tablet, Refills: 0    atorvastatin (LIPITOR) 80 MG tablet Take 1 tablet (80 mg total) by mouth daily at 6 PM. Qty: 30 tablet, Refills: 0    clopidogrel (PLAVIX) 75 MG tablet Take 1 tablet (75 mg total) by mouth daily. Qty: 30 tablet, Refills: 0      CONTINUE these medications which have NOT CHANGED   Details  Ascorbic Acid (VITAMIN C PO) Take 1 tablet by mouth daily.    B Complex Vitamins (B COMPLEX 1 PO) Take 1 tablet by mouth daily.    ibuprofen (ADVIL,MOTRIN) 200 MG tablet Take 400 mg by mouth every 6 (six) hours as needed for headache or  mild pain.    LUTEIN PO Take 1 tablet by mouth daily.    Multiple Vitamin (MULTIVITAMIN WITH MINERALS) TABS tablet Take 1 tablet by mouth daily.    VITAMIN E PO Take 1 capsule by mouth daily.      STOP taking these medications     naproxen sodium (ANAPROX) 220  MG tablet         Procedures/Studies: Ct Angio Head W Or Wo Contrast  Result Date: 07/22/2017 CLINICAL DATA:  Left facial droop and slurred speech EXAM: CT ANGIOGRAPHY HEAD AND NECK TECHNIQUE: Multidetector CT imaging of the head and neck was performed using the standard protocol during bolus administration of intravenous contrast. Multiplanar CT image reconstructions and MIPs were obtained to evaluate the vascular anatomy. Carotid stenosis measurements (when applicable) are obtained utilizing NASCET criteria, using the distal internal carotid diameter as the denominator. CONTRAST:  50 cc Isovue 370 intravenous COMPARISON:  Noncontrast head CT from earlier the same day FINDINGS: CTA NECK FINDINGS Aortic arch: Normal appearance.  Three vessel branching. Right carotid system: Vessels are smooth and widely patent. No noted atheromatous changes. Left carotid system: Vessels are smooth and widely patent. No noted atheromatous changes. Vertebral arteries: No proximal subclavian stenosis. Vertebral arteries are smooth and widely patent. Dominant left vertebral artery. Skeleton: No acute or aggressive finding cervical disc degeneration. There is an 11 mm sclerotic focus in the right aspect of the L3 body, 11 mm in diameter. Other neck: No noted mass or inflammation. Upper chest: Negative. Review of the MIP images confirms the above findings CTA HEAD FINDINGS Anterior circulation: No branch occlusion, stenosis, or aneurysm. Negative for beading Posterior circulation: Left dominant vertebral artery. Standard vertebrobasilar branching. No branch occlusion or flow limiting stenosis. Negative for beading or aneurysm. Venous sinuses: Widely patent.  Anatomic variants: None significant Delayed phase: Not performed in the emergent setting Review of the MIP images confirms the above findings IMPRESSION: Negative CTA of the head neck. No emergent large vessel occlusion, proximal stenosis, or atheromatous changes. 11 mm sclerotic focus in the T3 body is consistent with a bone island in the absence of malignancy history. Electronically Signed   By: Marnee Spring M.D.   On: 07/22/2017 12:42   Ct Angio Neck W Or Wo Contrast  Result Date: 07/22/2017 CLINICAL DATA:  Left facial droop and slurred speech EXAM: CT ANGIOGRAPHY HEAD AND NECK TECHNIQUE: Multidetector CT imaging of the head and neck was performed using the standard protocol during bolus administration of intravenous contrast. Multiplanar CT image reconstructions and MIPs were obtained to evaluate the vascular anatomy. Carotid stenosis measurements (when applicable) are obtained utilizing NASCET criteria, using the distal internal carotid diameter as the denominator. CONTRAST:  50 cc Isovue 370 intravenous COMPARISON:  Noncontrast head CT from earlier the same day FINDINGS: CTA NECK FINDINGS Aortic arch: Normal appearance.  Three vessel branching. Right carotid system: Vessels are smooth and widely patent. No noted atheromatous changes. Left carotid system: Vessels are smooth and widely patent. No noted atheromatous changes. Vertebral arteries: No proximal subclavian stenosis. Vertebral arteries are smooth and widely patent. Dominant left vertebral artery. Skeleton: No acute or aggressive finding cervical disc degeneration. There is an 11 mm sclerotic focus in the right aspect of the L3 body, 11 mm in diameter. Other neck: No noted mass or inflammation. Upper chest: Negative. Review of the MIP images confirms the above findings CTA HEAD FINDINGS Anterior circulation: No branch occlusion, stenosis, or aneurysm. Negative for beading Posterior circulation: Left dominant vertebral artery. Standard  vertebrobasilar branching. No branch occlusion or flow limiting stenosis. Negative for beading or aneurysm. Venous sinuses: Widely patent. Anatomic variants: None significant Delayed phase: Not performed in the emergent setting Review of the MIP images confirms the above findings IMPRESSION: Negative CTA of the head neck. No emergent large vessel occlusion, proximal stenosis, or atheromatous changes. 11  mm sclerotic focus in the T3 body is consistent with a bone island in the absence of malignancy history. Electronically Signed   By: Marnee Spring M.D.   On: 07/22/2017 12:42   Mr Brain Wo Contrast  Result Date: 07/22/2017 CLINICAL DATA:  61 y/o  M; slurred speech and left facial drooping. EXAM: MRI HEAD WITHOUT CONTRAST TECHNIQUE: Multiplanar, multiecho pulse sequences of the brain and surrounding structures were obtained without intravenous contrast. COMPARISON:  07/22/2017 CT angiogram of the head and CT of the head. FINDINGS: Brain: Right posterolateral frontal lobe reduced diffusion measuring 1.5 x 1.5 x 3.1 cm (volume = 3.7 cm^3) (AP x ML x CC series 3, image 31 and series 4, image 19) compatible with acute/ early subacute infarction. There are a few additional punctate foci of reduced diffusion in the right anterolateral frontal lobe. The infarct demonstrates mild T2 FLAIR hyperintense signal abnormality. No significant mass effect. No evidence for intracranial hemorrhage, hydrocephalus, or herniation. Vascular: Normal flow voids. Skull and upper cervical spine: Normal marrow signal. Sinuses/Orbits: Negative. Other: Well-circumscribed 8 mm T2 hyperintense, incomplete FLAIR suppression, T1 hypointense focus within superior aspect of the superficial lobe of left parotid gland with soft tissue attenuation on prior CT angiogram (series 6, image 2). IMPRESSION: 1. Right posterolateral frontal lobe acute/early subacute infarction, volume 3.7 cc. No acute hemorrhage. 2. Otherwise unremarkable MRI of the brain.  3. 8 mm lesion within the superior aspect of superficial lobe of left parotid gland with soft tissue attenuation on prior CT angiogram. Differential includes salivary neoplasm and proteinaceous cyst, unlikely lymph node given morphology. This area is likely amenable to ultrasound evaluation to further characterize. Electronically Signed   By: Mitzi Hansen M.D.   On: 07/22/2017 17:05   US Soft Tissue Head And Neck  Result Date: 07/23/2017 CLINICAL DATA:  Left parotid lesion by MRI EXAM: ULTRASOUND OF HEAD/NECK SOFT TISSUES TECHNIQUE: Ultrasound examination of the head and neck soft tissues was performed in the area of clinical concern. COMPARISON:  07/22/2017 brain MRI FINDINGS: Limited superficial soft tissue ultrasound performed of the left parotid gland. This demonstrates a left parotid intraparenchymal anechoic cyst with increased through transmission. No associated vascularity. Appearance compatible with a parotid cyst. Left parotid cyst measures 8 x 6 x 8 mm. This correlates with the MRI finding. IMPRESSION: 8 mm left parotid cyst. Electronically Signed   By: Judie Petit.  Shick M.D.   On: 07/23/2017 14:02   Ct Head Code Stroke Wo Contrast  Result Date: 07/22/2017 CLINICAL DATA:  Code stroke.  Left facial droop and slurred speech. EXAM: CT HEAD WITHOUT CONTRAST TECHNIQUE: Contiguous axial images were obtained from the base of the skull through the vertex without intravenous contrast. COMPARISON:  None. FINDINGS: Brain: Subtle area of gray-white blurring in the lateral and posterior right frontal lobe, also seen on sagittal reformats. No hemorrhage, hydrocephalus, or atrophy. Vascular: No hyperdense vessel. Minimal atherosclerotic calcification Skull: No acute or aggressive finding. Sinuses/Orbits: Negative Other: These results were called by telephone at the time of interpretation on 07/22/2017 at 12:09 pm to PA [facility] who reports a CT angio is pending. ASPECTS [facility] Stroke Program Early CT Score) -  Ganglionic level infarction (caudate, lentiform nuclei, internal capsule, insula, M1-M3 cortex): 7 - Supraganglionic infarction (M4-M6 cortex): 2 Total score (0-10 with 10 being normal): 9 IMPRESSION: 1. Possible small cortical infarct in the posterior right frontal lobe. ASPECTS is 9. 2. No acute hemorrhage. Electronically Signed   By: Marnee Spring M.D.   On: 07/22/2017  12:13      Subjective: Pt without complaints.  Pt asking to go home.   Discharge Exam: Vitals:   07/26/17 0947 07/26/17 1424  BP: 105/73 109/76  Pulse: (!) 58 66  Resp: 18 18  Temp: 97.9 F (36.6 C) 97.9 F (36.6 C)  SpO2: 98% 98%   Vitals:   07/26/17 0847 07/26/17 0855 07/26/17 0947 07/26/17 1424  BP: 106/78 (!) 107/59 105/73 109/76  Pulse: 63  (!) 58 66  Resp: 17  18 18   Temp: 97.8 F (36.6 C)  97.9 F (36.6 C) 97.9 F (36.6 C)  TempSrc: Oral  Oral Oral  SpO2: 97%  98% 98%  Weight:      Height:        General: Pt is alert, awake, not in acute distress Cardiovascular: RRR, S1/S2 +, no rubs, no gallops Respiratory: CTA bilaterally, no wheezing, no rhonchi Abdominal: Soft, NT, ND, bowel sounds + Extremities: no edema, no cyanosis   The results of significant diagnostics from this hospitalization (including imaging, microbiology, ancillary and laboratory) are listed below for reference.     Microbiology: No results found for this or any previous visit (from the past 240 hour(s)).   Labs: BNP (last 3 results) No results for input(s): BNP in the last 8760 hours. Basic Metabolic Panel:  Recent Labs Lab 07/22/17 1200 07/22/17 1207 07/24/17 0353 07/25/17 0328 07/26/17 0356 07/26/17 1152  NA 139 139 137 139 140 137  K 4.0 4.3 3.9 4.1 4.4 4.0  CL 105 104 104 106 106 104  CO2 26  --  25 26 27 27   GLUCOSE 95 92 103* 98 95 151*  BUN 19 27* 21* 20 21* 18  CREATININE 1.13 1.00 1.17 1.06 1.00 1.03  CALCIUM 9.1  --  9.0 8.9 9.1 9.1  MG  --   --   --   --  2.0  --   PHOS  --   --   --   --  3.7   --    Liver Function Tests:  Recent Labs Lab 07/22/17 1200 07/24/17 0353 07/26/17 0356  AST 22 17 24   ALT 22 17 23   ALKPHOS 78 70 70  BILITOT 1.1 0.9 1.0  PROT 6.5 5.8* 6.0*  ALBUMIN 3.8 3.2* 3.4*   No results for input(s): LIPASE, AMYLASE in the last 168 hours. No results for input(s): AMMONIA in the last 168 hours. CBC:  Recent Labs Lab 07/22/17 1200 07/22/17 1207 07/24/17 0353 07/26/17 0356  WBC 5.9  --  7.7 6.4  NEUTROABS 3.3  --   --   --   HGB 14.1 15.0 13.5 13.8  HCT 41.8 44.0 40.4 41.4  MCV 85.8  --  85.2 85.7  PLT 197  --  194 196   Cardiac Enzymes: No results for input(s): CKTOTAL, CKMB, CKMBINDEX, TROPONINI in the last 168 hours. BNP: Invalid input(s): POCBNP CBG:  Recent Labs Lab 07/22/17 1159  GLUCAP 85   D-Dimer No results for input(s): DDIMER in the last 72 hours. Hgb A1c No results for input(s): HGBA1C in the last 72 hours. Lipid Profile No results for input(s): CHOL, HDL, LDLCALC, TRIG, CHOLHDL, LDLDIRECT in the last 72 hours. Thyroid function studies No results for input(s): TSH, T4TOTAL, T3FREE, THYROIDAB in the last 72 hours.  Invalid input(s): FREET3 Anemia work up No results for input(s): VITAMINB12, FOLATE, FERRITIN, TIBC, IRON, RETICCTPCT in the last 72 hours. Urinalysis    Component Value Date/Time   COLORURINE STRAW (A) 07/22/2017 1422  APPEARANCEUR CLEAR 07/22/2017 1422   LABSPEC 1.017 07/22/2017 1422   PHURINE 7.0 07/22/2017 1422   GLUCOSEU NEGATIVE 07/22/2017 1422   HGBUR NEGATIVE 07/22/2017 1422   BILIRUBINUR NEGATIVE 07/22/2017 1422   KETONESUR NEGATIVE 07/22/2017 1422   PROTEINUR NEGATIVE 07/22/2017 1422   NITRITE NEGATIVE 07/22/2017 1422   LEUKOCYTESUR NEGATIVE 07/22/2017 1422   Sepsis Labs Invalid input(s): PROCALCITONIN,  WBC,  LACTICIDVEN Microbiology No results found for this or any previous visit (from the past 240 hour(s)).  Time coordinating discharge: 34 mins  SIGNED:  Standley Dakins,  MD  [facility] 07/26/2017, 4:37 PM Pager [phone]  If 7PM-7AM, please contact night-coverage [email or web address] Password [email or web address]

## 2017-07-26 NOTE — Progress Notes (Signed)
PROGRESS NOTE    Luis [name]  ZOX:[id number]  DOB: Oct 26, 1956  DOA: 07/22/2017 PCP: Patient, No Pcp Per   Brief Admission Hx: 61 y.o. male with medical history significant for dyslipidemia for which he stopped taking medications due to reports of knee pain and family history of CVA and MI presented with slurred speech and left facial drooping.     MDM/Assessment & Plan:   Stroke (R posterolateral frontal lobe acute/early subactue infarct 3.7cc) -Presented with abrupt onset of left facial drooping and dysarthria as well as mild difficulty managing oral secretions  -HgbA1c=5.7, and lipid LDL=144  (07/23/2017) -SLP evaluation:Recommendation is for regular diet with thin liquids -PT =>no PT follow up 8/20 -OT => no recommendations Echo=> EF 60-65%,  No cardiac source of emboli TEE: reveals PFO with right to left shunting, await stroke team recommendations.  Cont aspirin, plavix, Lipitor Neurology consulted, appreciate input  8mm lesion in the superior aspect of the left parotidgland  Ultrasound 8/18=> parathyroid cyst   HLD (hyperlipidemia) -States was on simvastatin in the past but quit taking due to knee pain he presumed was related to statin -start Lipitor  OA (osteoarthritis) -Chronic right knee pain that limits mobility but is never been formally diagnosed with osteoarthritis  Code Status : FULL CODE  Family Communication  : w patient  Disposition Plan  : home  Barriers For Discharge :    Consults  :  neurology  Subjective: Pt seen after TEE and says he feels drowsy after procedure.   Objective: Vitals:   07/26/17 0830 07/26/17 0847 07/26/17 0855 07/26/17 0947  BP: (!) 107/59 106/78 (!) 107/59 105/73  Pulse: (!) 52 63  (!) 58  Resp: 12 17  18   Temp:  97.8 F (36.6 C)  97.9 F (36.6 C)  TempSrc:  Oral  Oral  SpO2: 100% 97%  98%  Weight:      Height:       No intake or output data in the 24 hours ending 07/26/17 1038 Filed Weights   07/22/17 1954 07/26/17 0741  Weight: 76 kg (167 lb 8.8 oz) 75.8 kg (167 lb)   REVIEW OF SYSTEMS  As per history otherwise all reviewed and reported negative  Exam:  General exam: awake, alert, NAD Respiratory system: Clear. No increased work of breathing. Cardiovascular system: S1 & S2 heard, RRR. No JVD, murmurs, gallops, clicks or pedal edema. Gastrointestinal system: Abdomen is nondistended, soft and nontender. Normal bowel sounds heard. Central nervous system: Alert and oriented. No focal neurological deficits. Extremities: no CCE.  Data Reviewed: Basic Metabolic Panel:  Recent Labs Lab 07/22/17 1200 07/22/17 1207 07/24/17 0353 07/25/17 0328 07/26/17 0356  NA 139 139 137 139 140  K 4.0 4.3 3.9 4.1 4.4  CL 105 104 104 106 106  CO2 26  --  25 26 27   GLUCOSE 95 92 103* 98 95  BUN 19 27* 21* 20 21*  CREATININE 1.13 1.00 1.17 1.06 1.00  CALCIUM 9.1  --  9.0 8.9 9.1  MG  --   --   --   --  2.0  PHOS  --   --   --   --  3.7   Liver Function Tests:  Recent Labs Lab 07/22/17 1200 07/24/17 0353 07/26/17 0356  AST 22 17 24   ALT 22 17 23   ALKPHOS 78 70 70  BILITOT 1.1 0.9 1.0  PROT 6.5 5.8* 6.0*  ALBUMIN 3.8 3.2* 3.4*   No results for input(s): LIPASE, AMYLASE in the  last 168 hours. No results for input(s): AMMONIA in the last 168 hours. CBC:  Recent Labs Lab 07/22/17 1200 07/22/17 1207 07/24/17 0353 07/26/17 0356  WBC 5.9  --  7.7 6.4  NEUTROABS 3.3  --   --   --   HGB 14.1 15.0 13.5 13.8  HCT 41.8 44.0 40.4 41.4  MCV 85.8  --  85.2 85.7  PLT 197  --  194 196   Cardiac Enzymes: No results for input(s): CKTOTAL, CKMB, CKMBINDEX, TROPONINI in the last 168 hours. CBG (last 3)  No results for input(s): GLUCAP in the last 72 hours. No results found for this or any previous visit (from the past 240 hour(s)).   Studies: No results found.   Scheduled Meds: . aspirin EC  325 mg Oral Daily  . atorvastatin  40 mg Oral q1800  . clopidogrel  75 mg Oral  Daily   Continuous Infusions: . sodium chloride Stopped (07/22/17 2045)  . sodium chloride     Principal Problem:   Stroke-like symptoms Active Problems:   HLD (hyperlipidemia)   OA (osteoarthritis)   Parotid cyst   Cerebral embolism with cerebral infarction   Stroke (cerebrum) (HCC)   PFO (patent foramen ovale)  Time spent:   Standley Dakins, MD, FAAFP [facility] Pager [phone]  If 7PM-7AM, please contact night-coverage [email or web address] Password [email or web address] 07/26/2017, 10:38 AM    LOS: 3 days

## 2017-07-26 NOTE — Progress Notes (Signed)
  Echocardiogram Echocardiogram Transesophageal has been performed.  Tye Savoy 07/26/2017, 9:38 AM

## 2017-07-26 NOTE — Progress Notes (Signed)
Patient discharged home with family. Discharge instructions given. Telemetry and IV access discontinued. Patient questions asked and answered.  Pt to establish primary care this week.  Transported from unit via wheelchair. Lawson Radar

## 2017-07-26 NOTE — H&P (View-Only) (Signed)
 Cardiology Consultation:   Patient ID: Luis [name]; [id number]; 02/25/1956   Admit date: 07/22/2017 Date of Consult: 07/26/2017  Primary Care Provider: Patient, No Pcp Per Primary Cardiologist: none   Patient Profile:   Luis [name] is a 61 y.o. male with a hx of HLD only who is being seen today for the evaluation of ILR in the setting of cryptogenic stroke at the request of Dr. Sethi.  History of Present Illness:    [name] was admitted to [facility] with new onset L facial droop,, slurred speech and found with acute CVA.  Neurology notes: right frontal cortical infarct, right MCA territory - possibly embolic - source unknown, Due to relative young age and lack of significant risk factors will also do hypercoagulable work up TEE and loop pending for embolic work up and requests EP evaluation for loop implant.  TEE is scheduled for this AM.   He feels like he is near normal again, though still stumbles on some words.  He denies any hix of palpitations, no hx of syncope, near syncope.  No CP or hx of arrhythmias, no known cardiac history to date.  LABS K+ 4.4 BUN/Creat 21/1.00 WBC 6.4 H/H 13/41 plts 196  Past Medical History:  Diagnosis Date  . Hyperlipidemia     History reviewed. No pertinent surgical history.     Inpatient Medications: Scheduled Meds: . aspirin EC  325 mg Oral Daily  . atorvastatin  40 mg Oral q1800  . clopidogrel  75 mg Oral Daily   Continuous Infusions: . sodium chloride Stopped (07/22/17 2045)   PRN Meds: acetaminophen **OR** acetaminophen (TYLENOL) oral liquid 160 mg/5 mL **OR** acetaminophen, labetalol  Allergies:    Allergies  Allergen Reactions  . Flexeril [Cyclobenzaprine] Nausea Only    Social History:   Social History   Social History  . Marital status: Married    Spouse name: N/A  . Number of children: N/A  . Years of education: N/A   Occupational History  . Not on file.   Social History Main Topics  . Smoking  status: Former Smoker    Quit date: 12/06/1978  . Smokeless tobacco: Former User  . Alcohol use No  . Drug use: No  . Sexual activity: Not Currently   Other Topics Concern  . Not on file   Social History Narrative  . No narrative on file    Family History:   Family History  Problem Relation Age of Onset  . Atrial fibrillation Father   . Stroke Paternal Grandmother   . Heart attack Paternal Grandfather      ROS:  Please see the history of present illness.  ROS  All other ROS reviewed and negative.     Physical Exam/Data:   Vitals:   07/25/17 1826 07/25/17 2114 07/26/17 0146 07/26/17 0524  BP: 110/65 123/79 99/62 103/72  Pulse: 71 (!) 57 (!) 55 (!) 59  Resp: 20 20 20 20  Temp: 97.9 F (36.6 C) 99 F (37.2 C) 99.4 F (37.4 C) 99.2 F (37.3 C)  TempSrc: Oral Oral Oral Oral  SpO2: 97% 97% 98% 98%  Weight:      Height:       No intake or output data in the 24 hours ending 07/26/17 0731 Filed Weights   07/22/17 1954  Weight: 167 lb 8.8 oz (76 kg)   Body mass index is 25.48 kg/m.  General:  Well nourished, well developed, in no acute distress HEENT: normal Lymph: no adenopathy Neck: no   JVD Endocrine:  No thryomegaly Vascular: No carotid bruits Cardiac:  normal S1, S2; RRR; no murmurs, gallops or rubs Lungs:  clear to auscultation bilaterally, no wheezing, rhonchi or rales  Abd: soft, nontender, no hepatomegaly  Ext: no edema Musculoskeletal:  No deformities, BUE and BLE strength normal and equal Skin: warm and dry  Neuro:  No gross  focal abnormalities noted, continues to have trouble with occassional word finding Psych:  Normal affect   EKG:  All EKGs in Epic were personally reviewed and demonstrates:  SR/SB Telemetry:  Telemetry was personally reviewed and demonstrates:  SB/SR, occ PVCs, infrequent couplet 3 beat NSVT. 33 seconds of WCT, rate 110-120bpm  Relevant CV Studies:  07/23/17: TTE Study Conclusions - Left ventricle: The cavity size was normal.  There was mild focal   basal hypertrophy of the septum. Systolic function was normal.   The estimated ejection fraction was in the range of 60% to 65%.   Wall motion was normal; there were no regional wall motion   abnormalities. Doppler parameters are consistent with abnormal   left ventricular relaxation (grade 1 diastolic dysfunction). - Aortic valve: There was mild regurgitation. Impressions: - No cardiac source of emboli was indentified.  07/24/17: LE venous US Summary: - No obvious evidence of deep vein or superficial thrombosis   involving the right lower extremity and left lower extremity. - There is sluggish flow noted in the right common femoral and   popliteal veins, etiology unknown. Other specific details can be found in the table(s) above.  Laboratory Data:  Chemistry  Recent Labs Lab 07/24/17 0353 07/25/17 0328 07/26/17 0356  NA 137 139 140  K 3.9 4.1 4.4  CL 104 106 106  CO2 25 26 27  GLUCOSE 103* 98 95  BUN 21* 20 21*  CREATININE 1.17 1.06 1.00  CALCIUM 9.0 8.9 9.1  GFRNONAA >60 >60 >60  GFRAA >60 >60 >60  ANIONGAP 8 7 7     Recent Labs Lab 07/22/17 1200 07/24/17 0353 07/26/17 0356  PROT 6.5 5.8* 6.0*  ALBUMIN 3.8 3.2* 3.4*  AST 22 17 24  ALT 22 17 23  ALKPHOS 78 70 70  BILITOT 1.1 0.9 1.0   Hematology  Recent Labs Lab 07/22/17 1200 07/22/17 1207 07/24/17 0353 07/26/17 0356  WBC 5.9  --  7.7 6.4  RBC 4.87  --  4.74 4.83  HGB 14.1 15.0 13.5 13.8  HCT 41.8 44.0 40.4 41.4  MCV 85.8  --  85.2 85.7  MCH 29.0  --  28.5 28.6  MCHC 33.7  --  33.4 33.3  RDW 14.3  --  14.2 14.1  PLT 197  --  194 196   Cardiac EnzymesNo results for input(s): TROPONINI in the last 168 hours.   Recent Labs Lab 07/22/17 1205  TROPIPOC 0.00    BNPNo results for input(s): BNP, PROBNP in the last 168 hours.  DDimer No results for input(s): DDIMER in the last 168 hours.  Radiology/Studies:   Ct Angio Head W Or Wo Contrast Result Date:  07/22/2017 CLINICAL DATA:  Left facial droop and slurred speech EXAM: CT ANGIOGRAPHY HEAD AND NECK TECHNIQUE: Multidetector CT imaging of the head and neck was performed using the standard protocol during bolus administration of intravenous contrast. Multiplanar CT image reconstructions and MIPs were obtained to evaluate the vascular anatomy. Carotid stenosis measurements (when applicable) are obtained utilizing NASCET criteria, using the distal internal carotid diameter as the denominator. CONTRAST:  50 cc Isovue 370 intravenous COMPARISON:    Noncontrast head CT from earlier the same day FINDINGS: CTA NECK FINDINGS Aortic arch: Normal appearance.  Three vessel branching. Right carotid system: Vessels are smooth and widely patent. No noted atheromatous changes. Left carotid system: Vessels are smooth and widely patent. No noted atheromatous changes. Vertebral arteries: No proximal subclavian stenosis. Vertebral arteries are smooth and widely patent. Dominant left vertebral artery. Skeleton: No acute or aggressive finding cervical disc degeneration. There is an 11 mm sclerotic focus in the right aspect of the L3 body, 11 mm in diameter. Other neck: No noted mass or inflammation. Upper chest: Negative. Review of the MIP images confirms the above findings CTA HEAD FINDINGS Anterior circulation: No branch occlusion, stenosis, or aneurysm. Negative for beading Posterior circulation: Left dominant vertebral artery. Standard vertebrobasilar branching. No branch occlusion or flow limiting stenosis. Negative for beading or aneurysm. Venous sinuses: Widely patent. Anatomic variants: None significant Delayed phase: Not performed in the emergent setting Review of the MIP images confirms the above findings IMPRESSION: Negative CTA of the head neck. No emergent large vessel occlusion, proximal stenosis, or atheromatous changes. 11 mm sclerotic focus in the T3 body is consistent with a bone island in the absence of malignancy  history. Electronically Signed   By: Jonathon  Watts M.D.   On: 07/22/2017 12:42    Mr Brain Wo Contrast Result Date: 07/22/2017 CLINICAL DATA:  61 y/o  M; slurred speech and left facial drooping. EXAM: MRI HEAD WITHOUT CONTRAST TECHNIQUE: Multiplanar, multiecho pulse sequences of the brain and surrounding structures were obtained without intravenous contrast. COMPARISON:  07/22/2017 CT angiogram of the head and CT of the head. FINDINGS: Brain: Right posterolateral frontal lobe reduced diffusion measuring 1.5 x 1.5 x 3.1 cm (volume = 3.7 cm^3) (AP x ML x CC series 3, image 31 and series 4, image 19) compatible with acute/ early subacute infarction. There are a few additional punctate foci of reduced diffusion in the right anterolateral frontal lobe. The infarct demonstrates mild T2 FLAIR hyperintense signal abnormality. No significant mass effect. No evidence for intracranial hemorrhage, hydrocephalus, or herniation. Vascular: Normal flow voids. Skull and upper cervical spine: Normal marrow signal. Sinuses/Orbits: Negative. Other: Well-circumscribed 8 mm T2 hyperintense, incomplete FLAIR suppression, T1 hypointense focus within superior aspect of the superficial lobe of left parotid gland with soft tissue attenuation on prior CT angiogram (series 6, image 2). IMPRESSION: 1. Right posterolateral frontal lobe acute/early subacute infarction, volume 3.7 cc. No acute hemorrhage. 2. Otherwise unremarkable MRI of the brain. 3. 8 mm lesion within the superior aspect of superficial lobe of left parotid gland with soft tissue attenuation on prior CT angiogram. Differential includes salivary neoplasm and proteinaceous cyst, unlikely lymph node given morphology. This area is likely amenable to ultrasound evaluation to further characterize. Electronically Signed   By: Lance  Furusawa-Stratton M.D.   On: 07/22/2017 17:05    Us Soft Tissue Head And Neck Result Date: 07/23/2017 CLINICAL DATA:  Left parotid lesion by MRI  EXAM: ULTRASOUND OF HEAD/NECK SOFT TISSUES TECHNIQUE: Ultrasound examination of the head and neck soft tissues was performed in the area of clinical concern. COMPARISON:  07/22/2017 brain MRI FINDINGS: Limited superficial soft tissue ultrasound performed of the left parotid gland. This demonstrates a left parotid intraparenchymal anechoic cyst with increased through transmission. No associated vascularity. Appearance compatible with a parotid cyst. Left parotid cyst measures 8 x 6 x 8 mm. This correlates with the MRI finding. IMPRESSION: 8 mm left parotid cyst. Electronically Signed   By: M.  Shick M.D.     On: 07/23/2017 14:02    Ct Head Code Stroke Wo Contrast Result Date: 07/22/2017 CLINICAL DATA:  Code stroke.  Left facial droop and slurred speech. EXAM: CT HEAD WITHOUT CONTRAST TECHNIQUE: Contiguous axial images were obtained from the base of the skull through the vertex without intravenous contrast. COMPARISON:  None. FINDINGS: Brain: Subtle area of gray-white blurring in the lateral and posterior right frontal lobe, also seen on sagittal reformats. No hemorrhage, hydrocephalus, or atrophy. Vascular: No hyperdense vessel. Minimal atherosclerotic calcification Skull: No acute or aggressive finding. Sinuses/Orbits: Negative Other: These results were called by telephone at the time of interpretation on 07/22/2017 at 12:09 pm to PA Jamie who reports a CT angio is pending. ASPECTS (Alberta Stroke Program Early CT Score) - Ganglionic level infarction (caudate, lentiform nuclei, internal capsule, insula, M1-M3 cortex): 7 - Supraganglionic infarction (M4-M6 cortex): 2 Total score (0-10 with 10 being normal): 9 IMPRESSION: 1. Possible small cortical infarct in the posterior right frontal lobe. ASPECTS is 9. 2. No acute hemorrhage. Electronically Signed   By: Jonathon  Watts M.D.   On: 07/22/2017 12:13    Assessment and Plan:   1. Cryptogenic stroke    rationale for heart/rhythm monitoring were discussed with the  patient.    ILR procedure, risks and benefits, and alternative monitoring options were discussed with the patient    at this time the patient is clear he would like to proceed with ILR implant    wound care (3 days clean and dry) were discussed with the patient    wound check visit will be scheduled for the patient  2. WCT     Possible VT asymptomatic, normal LVEF, no WMA   Signed, Renee Lynn Ursuy, PA-C  07/26/2017 7:31 AM  I have seen, examined the patient, and reviewed the above assessment and plan.  On exam, RRR.  Changes to above are made where necessary.  Risks and benefits to ILR implantation were discussed with the patient who wishes to proceed.  Co Sign: Lebron Nauert, MD 07/26/2017 7:47 AM   

## 2017-07-26 NOTE — Interval H&P Note (Signed)
History and Physical Interval Note:  07/26/2017 8:15 AM  Luis [name]  has presented today for surgery, with the diagnosis of stroke  The various methods of treatment have been discussed with the patient and family. After consideration of risks, benefits and other options for treatment, the patient has consented to  Procedure(s): TRANSESOPHAGEAL ECHOCARDIOGRAM (TEE) WITH LOOP (N/A) as a surgical intervention .  The patient's history has been reviewed, patient examined, no change in status, stable for surgery.  I have reviewed the patient's chart and labs.  Questions were answered to the patient's satisfaction.     [name]

## 2017-07-26 NOTE — CV Procedure (Signed)
    Transesophageal Echocardiogram Note  Luis [name] [id number] [birth year]  Procedure: Transesophageal Echocardiogram Indications: CVA   Procedure Details Consent: Obtained Time Out: Verified patient identification, verified procedure, site/side was marked, verified correct patient position, special equipment/implants available, Radiology Safety Procedures followed,  medications/allergies/relevent history reviewed, required imaging and test results available.  Performed  Medications:  During this procedure the patient is administered a total of Versed 4 mg and Fentanyl 50  mcg  to achieve and maintain moderate conscious sedation.  The patient's heart rate, blood pressure, and oxygen saturation are monitored continuously during the procedure. The period of conscious sedation is 30  minutes, of which I was present face-to-face 100% of this time.  Left Ventrical:  Normal LV function ( heart is rotated, difficult to see well in all views)   Mitral Valve: normal  Aortic Valve: normal, insignificant AI  Tricuspid Valve: normal, mild TR   Pulmonic Valve: normal, trivial PI  Left Atrium/ Left atrial appendage: no thrombi   Atrial septum: + PFO with right to left shunting visualized with bubble study  Aorta: mild plaque    Complications: No apparent complications Patient did tolerate procedure well.   Vesta Mixer, Montez Hageman., MD, [facility] 07/26/2017, 8:33 AM

## 2017-07-26 NOTE — Consult Note (Signed)
Cardiology Consultation:   Patient ID: Luis [name]; [id number]; 1956/04/07   Admit date: 07/22/2017 Date of Consult: 07/26/2017  Primary Care Provider: Patient, No Pcp Per Primary Cardiologist: none   Patient Profile:   Luis [name] is a 61 y.o. male with a hx of HLD only who is being seen today for the evaluation of ILR in the setting of cryptogenic stroke at the request of Dr. Pearlean Brownie.  History of Present Illness:    [name] was admitted to [facility] with new onset L facial droop,, slurred speech and found with acute CVA.  Neurology notes: right frontal cortical infarct, right MCA territory - possibly embolic - source unknown, Due to relative young age and lack of significant risk factors will also do hypercoagulable work up TEE and loop pending for embolic work up and requests EP evaluation for loop implant.  TEE is scheduled for this AM.   He feels like he is near normal again, though still stumbles on some words.  He denies any hix of palpitations, no hx of syncope, near syncope.  No CP or hx of arrhythmias, no known cardiac history to date.  LABS K+ 4.4 BUN/Creat 21/1.00 WBC 6.4 H/H 13/41 plts 196  Past Medical History:  Diagnosis Date  . Hyperlipidemia     History reviewed. No pertinent surgical history.     Inpatient Medications: Scheduled Meds: . aspirin EC  325 mg Oral Daily  . atorvastatin  40 mg Oral q1800  . clopidogrel  75 mg Oral Daily   Continuous Infusions: . sodium chloride Stopped (07/22/17 2045)   PRN Meds: acetaminophen **OR** acetaminophen (TYLENOL) oral liquid 160 mg/5 mL **OR** acetaminophen, labetalol  Allergies:    Allergies  Allergen Reactions  . Flexeril [Cyclobenzaprine] Nausea Only    Social History:   Social History   Social History  . Marital status: Married    Spouse name: N/A  . Number of children: N/A  . Years of education: N/A   Occupational History  . Not on file.   Social History Main Topics  . Smoking  status: Former Smoker    Quit date: 12/06/1978  . Smokeless tobacco: Former Neurosurgeon  . Alcohol use No  . Drug use: No  . Sexual activity: Not Currently   Other Topics Concern  . Not on file   Social History Narrative  . No narrative on file    Family History:   Family History  Problem Relation Age of Onset  . Atrial fibrillation Father   . Stroke Paternal Grandmother   . Heart attack Paternal Grandfather      ROS:  Please see the history of present illness.  ROS  All other ROS reviewed and negative.     Physical Exam/Data:   Vitals:   07/25/17 1826 07/25/17 2114 07/26/17 0146 07/26/17 0524  BP: 110/65 123/79 99/62 103/72  Pulse: 71 (!) 57 (!) 55 (!) 59  Resp: 20 20 20 20   Temp: 97.9 F (36.6 C) 99 F (37.2 C) 99.4 F (37.4 C) 99.2 F (37.3 C)  TempSrc: Oral Oral Oral Oral  SpO2: 97% 97% 98% 98%  Weight:      Height:       No intake or output data in the 24 hours ending 07/26/17 0731 Filed Weights   07/22/17 1954  Weight: 167 lb 8.8 oz (76 kg)   Body mass index is 25.48 kg/m.  General:  Well nourished, well developed, in no acute distress HEENT: normal Lymph: no adenopathy Neck: no  JVD Endocrine:  No thryomegaly Vascular: No carotid bruits Cardiac:  normal S1, S2; RRR; no murmurs, gallops or rubs Lungs:  clear to auscultation bilaterally, no wheezing, rhonchi or rales  Abd: soft, nontender, no hepatomegaly  Ext: no edema Musculoskeletal:  No deformities, BUE and BLE strength normal and equal Skin: warm and dry  Neuro:  No gross  focal abnormalities noted, continues to have trouble with occassional word finding Psych:  Normal affect   EKG:  All EKGs in Epic were personally reviewed and demonstrates:  SR/SB Telemetry:  Telemetry was personally reviewed and demonstrates:  SB/SR, occ PVCs, infrequent couplet 3 beat NSVT. 33 seconds of WCT, rate 110-120bpm  Relevant CV Studies:  07/23/17: TTE Study Conclusions - Left ventricle: The cavity size was normal.  There was mild focal   basal hypertrophy of the septum. Systolic function was normal.   The estimated ejection fraction was in the range of 60% to 65%.   Wall motion was normal; there were no regional wall motion   abnormalities. Doppler parameters are consistent with abnormal   left ventricular relaxation (grade 1 diastolic dysfunction). - Aortic valve: There was mild regurgitation. Impressions: - No cardiac source of emboli was indentified.  07/24/17: LE venous US Summary: - No obvious evidence of deep vein or superficial thrombosis   involving the right lower extremity and left lower extremity. - There is sluggish flow noted in the right common femoral and   popliteal veins, etiology unknown. Other specific details can be found in the table(s) above.  Laboratory Data:  Chemistry  Recent Labs Lab 07/24/17 0353 07/25/17 0328 07/26/17 0356  NA 137 139 140  K 3.9 4.1 4.4  CL 104 106 106  CO2 25 26 27   GLUCOSE 103* 98 95  BUN 21* 20 21*  CREATININE 1.17 1.06 1.00  CALCIUM 9.0 8.9 9.1  GFRNONAA >60 >60 >60  GFRAA >60 >60 >60  ANIONGAP 8 7 7      Recent Labs Lab 07/22/17 1200 07/24/17 0353 07/26/17 0356  PROT 6.5 5.8* 6.0*  ALBUMIN 3.8 3.2* 3.4*  AST 22 17 24   ALT 22 17 23   ALKPHOS 78 70 70  BILITOT 1.1 0.9 1.0   Hematology  Recent Labs Lab 07/22/17 1200 07/22/17 1207 07/24/17 0353 07/26/17 0356  WBC 5.9  --  7.7 6.4  RBC 4.87  --  4.74 4.83  HGB 14.1 15.0 13.5 13.8  HCT 41.8 44.0 40.4 41.4  MCV 85.8  --  85.2 85.7  MCH 29.0  --  28.5 28.6  MCHC 33.7  --  33.4 33.3  RDW 14.3  --  14.2 14.1  PLT 197  --  194 196   Cardiac EnzymesNo results for input(s): TROPONINI in the last 168 hours.   Recent Labs Lab 07/22/17 1205  TROPIPOC 0.00    BNPNo results for input(s): BNP, PROBNP in the last 168 hours.  DDimer No results for input(s): DDIMER in the last 168 hours.  Radiology/Studies:   Ct Angio Head W Or Wo Contrast Result Date:  07/22/2017 CLINICAL DATA:  Left facial droop and slurred speech EXAM: CT ANGIOGRAPHY HEAD AND NECK TECHNIQUE: Multidetector CT imaging of the head and neck was performed using the standard protocol during bolus administration of intravenous contrast. Multiplanar CT image reconstructions and MIPs were obtained to evaluate the vascular anatomy. Carotid stenosis measurements (when applicable) are obtained utilizing NASCET criteria, using the distal internal carotid diameter as the denominator. CONTRAST:  50 cc Isovue 370 intravenous COMPARISON:  Noncontrast head CT from earlier the same day FINDINGS: CTA NECK FINDINGS Aortic arch: Normal appearance.  Three vessel branching. Right carotid system: Vessels are smooth and widely patent. No noted atheromatous changes. Left carotid system: Vessels are smooth and widely patent. No noted atheromatous changes. Vertebral arteries: No proximal subclavian stenosis. Vertebral arteries are smooth and widely patent. Dominant left vertebral artery. Skeleton: No acute or aggressive finding cervical disc degeneration. There is an 11 mm sclerotic focus in the right aspect of the L3 body, 11 mm in diameter. Other neck: No noted mass or inflammation. Upper chest: Negative. Review of the MIP images confirms the above findings CTA HEAD FINDINGS Anterior circulation: No branch occlusion, stenosis, or aneurysm. Negative for beading Posterior circulation: Left dominant vertebral artery. Standard vertebrobasilar branching. No branch occlusion or flow limiting stenosis. Negative for beading or aneurysm. Venous sinuses: Widely patent. Anatomic variants: None significant Delayed phase: Not performed in the emergent setting Review of the MIP images confirms the above findings IMPRESSION: Negative CTA of the head neck. No emergent large vessel occlusion, proximal stenosis, or atheromatous changes. 11 mm sclerotic focus in the T3 body is consistent with a bone island in the absence of malignancy  history. Electronically Signed   By: Marnee Spring M.D.   On: 07/22/2017 12:42    Mr Brain Wo Contrast Result Date: 07/22/2017 CLINICAL DATA:  61 y/o  M; slurred speech and left facial drooping. EXAM: MRI HEAD WITHOUT CONTRAST TECHNIQUE: Multiplanar, multiecho pulse sequences of the brain and surrounding structures were obtained without intravenous contrast. COMPARISON:  07/22/2017 CT angiogram of the head and CT of the head. FINDINGS: Brain: Right posterolateral frontal lobe reduced diffusion measuring 1.5 x 1.5 x 3.1 cm (volume = 3.7 cm^3) (AP x ML x CC series 3, image 31 and series 4, image 19) compatible with acute/ early subacute infarction. There are a few additional punctate foci of reduced diffusion in the right anterolateral frontal lobe. The infarct demonstrates mild T2 FLAIR hyperintense signal abnormality. No significant mass effect. No evidence for intracranial hemorrhage, hydrocephalus, or herniation. Vascular: Normal flow voids. Skull and upper cervical spine: Normal marrow signal. Sinuses/Orbits: Negative. Other: Well-circumscribed 8 mm T2 hyperintense, incomplete FLAIR suppression, T1 hypointense focus within superior aspect of the superficial lobe of left parotid gland with soft tissue attenuation on prior CT angiogram (series 6, image 2). IMPRESSION: 1. Right posterolateral frontal lobe acute/early subacute infarction, volume 3.7 cc. No acute hemorrhage. 2. Otherwise unremarkable MRI of the brain. 3. 8 mm lesion within the superior aspect of superficial lobe of left parotid gland with soft tissue attenuation on prior CT angiogram. Differential includes salivary neoplasm and proteinaceous cyst, unlikely lymph node given morphology. This area is likely amenable to ultrasound evaluation to further characterize. Electronically Signed   By: Mitzi Hansen M.D.   On: 07/22/2017 17:05    US Soft Tissue Head And Neck Result Date: 07/23/2017 CLINICAL DATA:  Left parotid lesion by MRI  EXAM: ULTRASOUND OF HEAD/NECK SOFT TISSUES TECHNIQUE: Ultrasound examination of the head and neck soft tissues was performed in the area of clinical concern. COMPARISON:  07/22/2017 brain MRI FINDINGS: Limited superficial soft tissue ultrasound performed of the left parotid gland. This demonstrates a left parotid intraparenchymal anechoic cyst with increased through transmission. No associated vascularity. Appearance compatible with a parotid cyst. Left parotid cyst measures 8 x 6 x 8 mm. This correlates with the MRI finding. IMPRESSION: 8 mm left parotid cyst. Electronically Signed   By: Judie Petit.  Shick M.D.  On: 07/23/2017 14:02    Ct Head Code Stroke Wo Contrast Result Date: 07/22/2017 CLINICAL DATA:  Code stroke.  Left facial droop and slurred speech. EXAM: CT HEAD WITHOUT CONTRAST TECHNIQUE: Contiguous axial images were obtained from the base of the skull through the vertex without intravenous contrast. COMPARISON:  None. FINDINGS: Brain: Subtle area of gray-white blurring in the lateral and posterior right frontal lobe, also seen on sagittal reformats. No hemorrhage, hydrocephalus, or atrophy. Vascular: No hyperdense vessel. Minimal atherosclerotic calcification Skull: No acute or aggressive finding. Sinuses/Orbits: Negative Other: These results were called by telephone at the time of interpretation on 07/22/2017 at 12:09 pm to PA [facility] who reports a CT angio is pending. ASPECTS [facility] Stroke Program Early CT Score) - Ganglionic level infarction (caudate, lentiform nuclei, internal capsule, insula, M1-M3 cortex): 7 - Supraganglionic infarction (M4-M6 cortex): 2 Total score (0-10 with 10 being normal): 9 IMPRESSION: 1. Possible small cortical infarct in the posterior right frontal lobe. ASPECTS is 9. 2. No acute hemorrhage. Electronically Signed   By: Marnee Spring M.D.   On: 07/22/2017 12:13    Assessment and Plan:   1. Cryptogenic stroke    rationale for heart/rhythm monitoring were discussed with the  patient.    ILR procedure, risks and benefits, and alternative monitoring options were discussed with the patient    at this time the patient is clear he would like to proceed with ILR implant    wound care (3 days clean and dry) were discussed with the patient    wound check visit will be scheduled for the patient  2. WCT     Possible VT asymptomatic, normal LVEF, no WMA   Signed, Sheilah Pigeon, PA-C  07/26/2017 7:31 AM  I have seen, examined the patient, and reviewed the above assessment and plan.  On exam, RRR.  Changes to above are made where necessary.  Risks and benefits to ILR implantation were discussed with the patient who wishes to proceed.  Co Sign: Hillis Range, MD 07/26/2017 7:47 AM

## 2017-07-26 NOTE — Discharge Instructions (Signed)
Follow with Primary MD  Doreene Eland, MD  and other consultant's as instructed your Hospitalist MD  Please get a complete blood count and chemistry panel checked by your Primary MD at your next visit, and again as instructed by your Primary MD.  Get Medicines reviewed and adjusted: Please take all your medications with you for your next visit with your Primary MD  Laboratory/radiological data: Please request your Primary MD to go over all hospital tests and procedure/radiological results at the follow up, please ask your Primary MD to get all Hospital records sent to his/her office.  In some cases, they will be blood work, cultures and biopsy results pending at the time of your discharge. Please request that your primary care M.D. follows up on these results.  Also Note the following: If you experience worsening of your admission symptoms, develop shortness of breath, life threatening emergency, suicidal or homicidal thoughts you must seek medical attention immediately by calling 911 or calling your MD immediately  if symptoms less severe.  You must read complete instructions/literature along with all the possible adverse reactions/side effects for all the Medicines you take and that have been prescribed to you. Take any new Medicines after you have completely understood and accpet all the possible adverse reactions/side effects.   Do not drive when taking Pain medications or sleeping medications (Benzodaizepines)  Do not take more than prescribed Pain, Sleep and Anxiety Medications. It is not advisable to combine anxiety,sleep and pain medications without talking with your primary care practitioner  Special Instructions: If you have smoked or chewed Tobacco  in the last 2 yrs please stop smoking, stop any regular Alcohol  and or any Recreational drug use.  Wear Seat belts while driving.  Please note: You were cared for by a hospitalist during your hospital stay. Once you are  discharged, your primary care physician will handle any further medical issues. Please note that NO REFILLS for any discharge medications will be authorized once you are discharged, as it is imperative that you return to your primary care physician (or establish a relationship with a primary care physician if you do not have one) for your post hospital discharge needs so that they can reassess your need for medications and monitor your lab values.

## 2017-07-27 ENCOUNTER — Encounter (HOSPITAL_COMMUNITY): Payer: Self-pay | Admitting: Internal Medicine

## 2017-07-29 ENCOUNTER — Other Ambulatory Visit: Payer: Self-pay

## 2017-07-29 NOTE — Patient Outreach (Signed)
[facility]) Care Management  07/29/2017  Luis [name] [birth year] [id number]     EMMI-STROKE RED ON EMMI ALERT Day # 1 Date: 07/28/17 Red Alert Reason: "Problems setting up rehab? Yes"   Outreach attempt # 1 to patient.  Spoke with patient. He reports he is getting better and stronger since discharge. Reviewed and addressed red alert with patient. He voices that he has not heard anything about his outpt neuro rehab. Advised patient to contact neuro office to inform this of this and request f/u. He states he would prefer to go to outpt rehab in [place] instead of [place] as it is closer to him. His family is assisting with his care needs and providing transportation to appts. Patient has discharge papers and is aware of f/u appts. He has PCP f/u appt on 08/05/17, wound pacer check on 08/03/17 and Dr. Pearlean Brownie appt on 09/13/17. Patient voices he has all his meds and understands when and how to take meds. No further RN CM needs or concerns at this time. Advised patient that they would continue to get automated EMMI-Stroke post discharge calls to assess how they are doing following recent hospitalization and will receive a call from a nurse if any of their responses were abnormal. Patient voiced understanding and was appreciative of f/u call.      Plan: RN CM will notify [facility] administrative assistant of case status.   Antionette Fairy, RN,BSN,CCM [facility] Care Management Telephonic Care Management Coordinator Direct Phone: [phone] Toll Free: [phone] Fax: [phone]

## 2017-08-01 ENCOUNTER — Telehealth: Payer: Self-pay | Admitting: Neurology

## 2017-08-01 NOTE — Telephone Encounter (Signed)
Pt called he has not rec'd a call reg PT since his discharge from hospital 07/26/17. He would like PT referral to be done in [place] or [place]. Patient also said he has an appt with PCP Dr Maisie Fus 08/05/17. Please call to discuss PT.

## 2017-08-01 NOTE — Telephone Encounter (Signed)
If patient calls back he needs to find a place in [place] or [place] that does outpatient therapy. We need the name and number so we can send orders there.  Rn left vm for patient to call back during business orders.

## 2017-08-02 ENCOUNTER — Other Ambulatory Visit: Payer: Self-pay

## 2017-08-02 DIAGNOSIS — I6389 Other cerebral infarction: Secondary | ICD-10-CM

## 2017-08-02 NOTE — Telephone Encounter (Signed)
Order put in for outpatient physical therapy.  PT prefers for it be done in [place] or [employer]. Pt was discharge from hospital last week,and stated it was never set up by the hospita.

## 2017-08-02 NOTE — Telephone Encounter (Addendum)
Patient called and requested to go somewhere in [place] or [place], he does not have a specific location. Dana C. Is going to find a location and send the referral.   Katrina RN; would you please place the referral in the system?

## 2017-08-03 ENCOUNTER — Ambulatory Visit (INDEPENDENT_AMBULATORY_CARE_PROVIDER_SITE_OTHER): Payer: Self-pay | Admitting: *Deleted

## 2017-08-03 DIAGNOSIS — I639 Cerebral infarction, unspecified: Secondary | ICD-10-CM

## 2017-08-03 LAB — CUP PACEART INCLINIC DEVICE CHECK
Date Time Interrogation Session: 20180829165523
Implantable Pulse Generator Implant Date: 20180821

## 2017-08-03 NOTE — Telephone Encounter (Signed)
Patient's referral has been sent to [facility] in [place]   Telephone [phone] - fax [phone] . I have called and left patient a message as well. Thanks Annabelle Harmanana.

## 2017-08-03 NOTE — Progress Notes (Signed)
Wound check appointment. Steri-strips removed. Wound without redness or edema. Incision edges approximated, wound well healed. Normal device function. Battery status: good. R-waves 0.73mV. No symptom, tachy, or AF episodes. Pause and brady detection remain off. Patient educated about wound care and Carelink monitor. Monthly summary reports and ROV with JA PRN.

## 2017-08-09 ENCOUNTER — Other Ambulatory Visit: Payer: Self-pay

## 2017-08-09 NOTE — Patient Outreach (Signed)
[facility] Care Management  08/09/2017  Luis BombardJoseph [name] [birth year] [id number]     EMMI-STROKE RED ON EMMI ALERT Day # 9 Date: 08/05/17 Red Alert Reason: "Lost interest in things they used to enjoy? Yes"   Outreach attempt #1 to patient. Spoke with patient who states he is doing well. Reviewed and addressed red alert with patient. He voices that he responded "no" to automated question and machine did not hear him correctly. He states that he completed PCP f/u appt on last week. Patient voices that MD office did not get his hospital d/c summary and paperwork. Patient inquiring about how he cam obtain records for MD office. RN CM provided patient with number to contact to speak with someone in medical records regarding the matter. He voices that he was finally abel to get outpatient therapy set up locally. He goes tomorrow for his first session. He states that he has started walking daily to get some exercise in. He voices no further RN CM needs or concerns at this time.      Plan: RN [facility] will [facility] of case status.    Antionette Fairyoshanda Davien Malone, RN,BSN,CCM [facility] Care Management Telephonic Care Management Coordinator Direct Phone: [phone] Toll Free: [phone] Fax: [phone]

## 2017-08-25 ENCOUNTER — Ambulatory Visit (INDEPENDENT_AMBULATORY_CARE_PROVIDER_SITE_OTHER): Payer: 59 | Admitting: *Deleted

## 2017-08-25 DIAGNOSIS — I639 Cerebral infarction, unspecified: Secondary | ICD-10-CM

## 2017-08-26 LAB — CUP PACEART REMOTE DEVICE CHECK
Date Time Interrogation Session: 20180920193700
MDC IDC PG IMPLANT DT: 20180821

## 2017-08-26 NOTE — Progress Notes (Signed)
Carelink Summary Report / Loop Recorder 

## 2017-09-13 ENCOUNTER — Ambulatory Visit (INDEPENDENT_AMBULATORY_CARE_PROVIDER_SITE_OTHER): Payer: 59 | Admitting: Neurology

## 2017-09-13 ENCOUNTER — Encounter: Payer: Self-pay | Admitting: Neurology

## 2017-09-13 VITALS — BP 119/71 | HR 49 | Wt 180.4 lb

## 2017-09-13 DIAGNOSIS — I639 Cerebral infarction, unspecified: Secondary | ICD-10-CM

## 2017-09-13 NOTE — Patient Instructions (Signed)
I had a long d/w patient and his wife about his recent cryptogenic stroke, risk for recurrent stroke/TIAs, personally independently reviewed imaging studies and stroke evaluation results and answered questions.Continue  aspirin 325 mg daily and discontinue Plavix as I do not see benefit of to antiplatelet therapy beyond 3 weeks  for secondary stroke prevention and maintain strict control of hypertension with blood pressure goal below 130/90, diabetes with hemoglobin A1c goal below 6.5% and lipids with LDL cholesterol goal below 70 mg/dL. I also advised the patient to eat a healthy diet with plenty of whole grains, cereals, fruits and vegetables, exercise regularly and maintain ideal body weight. The patient has made significant recovery and he may return back to work without restrictions. He may also drive a motor vehicle without restrictions. He may consider possible participation in the [facility] stroke prevention trial if interested. He was given information to review Followup in the future with me in 6 months or call earlier if necessary Stroke Prevention Some medical conditions and behaviors are associated with an increased chance of having a stroke. You may prevent a stroke by making healthy choices and managing medical conditions. How can I reduce my risk of having a stroke?  Stay physically active. Get at least 30 minutes of activity on most or all days.  Do not smoke. It may also be helpful to avoid exposure to secondhand smoke.  Limit alcohol use. Moderate alcohol use is considered to be: ? No more than 2 drinks per day for men. ? No more than 1 drink per day for nonpregnant women.  Eat healthy foods. This involves: ? Eating 5 or more servings of fruits and vegetables a day. ? Making dietary changes that address high blood pressure (hypertension), high cholesterol, diabetes, or obesity.  Manage your cholesterol levels. ? Making food choices that are high in fiber and low in saturated fat,  trans fat, and cholesterol may control cholesterol levels. ? Take any prescribed medicines to control cholesterol as directed by your health care provider.  Manage your diabetes. ? Controlling your carbohydrate and sugar intake is recommended to manage diabetes. ? Take any prescribed medicines to control diabetes as directed by your health care provider.  Control your hypertension. ? Making food choices that are low in salt (sodium), saturated fat, trans fat, and cholesterol is recommended to manage hypertension. ? Ask your health care provider if you need treatment to lower your blood pressure. Take any prescribed medicines to control hypertension as directed by your health care provider. ? If you are 49-2 years of age, have your blood pressure checked every 3-5 years. If you are 52 years of age or older, have your blood pressure checked every year.  Maintain a healthy weight. ? Reducing calorie intake and making food choices that are low in sodium, saturated fat, trans fat, and cholesterol are recommended to manage weight.  Stop drug abuse.  Avoid taking birth control pills. ? Talk to your health care provider about the risks of taking birth control pills if you are over 57 years old, smoke, get migraines, or have ever had a blood clot.  Get evaluated for sleep disorders (sleep apnea). ? Talk to your health care provider about getting a sleep evaluation if you snore a lot or have excessive sleepiness.  Take medicines only as directed by your health care provider. ? For some people, aspirin or blood thinners (anticoagulants) are helpful in reducing the risk of forming abnormal blood clots that can lead to stroke. If  you have the irregular heart rhythm of atrial fibrillation, you should be on a blood thinner unless there is a good reason you cannot take them. ? Understand all your medicine instructions.  Make sure that other conditions (such as anemia or atherosclerosis) are  addressed. Get help right away if:  You have sudden weakness or numbness of the face, arm, or leg, especially on one side of the body.  Your face or eyelid droops to one side.  You have sudden confusion.  You have trouble speaking (aphasia) or understanding.  You have sudden trouble seeing in one or both eyes.  You have sudden trouble walking.  You have dizziness.  You have a loss of balance or coordination.  You have a sudden, severe headache with no known cause.  You have new chest pain or an irregular heartbeat. Any of these symptoms may represent a serious problem that is an emergency. Do not wait to see if the symptoms will go away. Get medical help at once. Call your local emergency services (911 in [place] Do not drive yourself to the hospital. This information is not intended to replace advice given to you by your health care provider. Make sure you discuss any questions you have with your health care provider. Document Released: 12/30/2004 Document Revised: 04/29/2016 Document Reviewed: 05/25/2013 Elsevier Interactive Patient Education  2017 [employer].

## 2017-09-13 NOTE — Progress Notes (Signed)
[facility] [address] [place]. [place] [address] [phone]       OFFICE FOLLOW-UP NOTE  Mr. Rehman Levinson Date of Birth:  1956/05/20 Medical Record Number:  [id number]   HPI: Mr. Maynes is a 61 year old Caucasian male seen today for follow-up after recent hospitalization for stroke in August 2018. He is accompanied by his wife. History is obtained from the patient and review of electronic medical records and have personally viewed imaging findings. Rollyn Vuncannonis an 61 y.o.malewithout any known medical conditions presented to [facility] with complaints of left facial droop, slurred speech and dysphagia. Mr. Kocsis stated that he woke at 0600 and spoke to his mother without symptoms. He went back to bed and woke again at 0930 with the above symptoms. He presented to [facility] for evaluation and treatment. CT head 07/22/17: Possible small cortical infarct in the posterior right frontal lobe.CTA head/neck 07/22/17: Negative CTA of the head neck.Date last known well: Date: 07/22/2017.Time last known well: Time: 06:00 on 0/17/18.tPA Given:No:   minimal symptoms MRI scan of the brain showed a right posterior lateral frontal lobe acute to early subacute infarct. CT angiogram of the brain and neck showed no significant mass effect stenosis or occlusion. Transthoracic echo showed normal ejection fraction. Lower extremity venous Doppler showed no evidence of DVT. Tonsils echocardiogram showed small PFO cardiac source of embolism. LDL cholesterol elevated at 144 mg percent. Hemoglobin A1c was 5.7. Left lower follow-up vasculitis and hypercoagulable panel were both unremarkable. Urine drug screen was negative. Patient was felt to have a cryptogenic stroke and underwent loop recorder effacement. So far if it has not been found. He is tolerating aspirin and Plavix but complains of easy bruising. Patient states his made good physical recovery from his stroke but at times his balance is off  of antiplatelet and gets up quickly. Still has mild left facial droop. Patient wants to return to work. He wants to drive. He is tolerating Lipitor well without muscle aches and pains. His blood pressure is well controlled and today it is 119/71. He states he started eating healthy and he wants to be more active  And do regular exercise.  ROS:   14 system review of systems is positive for  hearing loss, ringing in the ears, snoring, joint pain, allergies and all other systems negative PMH:  Past Medical History:  Diagnosis Date  . Hyperlipidemia   . Stroke [facility])     Social History:  Social History   Social History  . Marital status: Married    Spouse name: N/A  . Number of children: N/A  . Years of education: N/A   Occupational History  . Not on file.   Social History Main Topics  . Smoking status: Former Smoker    Quit date: 12/06/1978  . Smokeless tobacco: Former Neurosurgeon  . Alcohol use No  . Drug use: No  . Sexual activity: Not Currently   Other Topics Concern  . Not on file   Social History Narrative  . No narrative on file    Medications:   Current Outpatient Prescriptions on File Prior to Visit  Medication Sig Dispense Refill  . Ascorbic Acid (VITAMIN C PO) Take 1 tablet by mouth daily.    Marland Kitchen aspirin EC 325 MG EC tablet Take 1 tablet (325 mg total) by mouth daily. 30 tablet 0  . atorvastatin (LIPITOR) 80 MG tablet Take 1 tablet (80 mg total) by mouth daily at 6 PM. 30 tablet 0  . B Complex  Vitamins (B COMPLEX 1 PO) Take 1 tablet by mouth daily.    Marland Kitchen ibuprofen (ADVIL,MOTRIN) 200 MG tablet Take 400 mg by mouth every 6 (six) hours as needed for headache or mild pain.    Marland Kitchen LUTEIN PO Take 1 tablet by mouth daily.    . Multiple Vitamin (MULTIVITAMIN WITH MINERALS) TABS tablet Take 1 tablet by mouth daily.     No current facility-administered medications on file prior to visit.     Allergies:   Allergies  Allergen Reactions  . Flexeril [Cyclobenzaprine] Nausea Only     Physical Exam General: well developed, well nourished Middle-age Caucasian male, seated, in no evident distress Head: head normocephalic and atraumatic.  Neck: supple with no carotid or supraclavicular bruits Cardiovascular: regular rate and rhythm, no murmurs Musculoskeletal: no deformity Skin:  no rash/petichiae Vascular:  Normal pulses all extremities Vitals:   09/13/17 1025  BP: 119/71  Pulse: (!) 49   Neurologic Exam Mental Status: Awake and fully alert. Oriented to place and time. Recent and remote memory intact. Attention span, concentration and fund of knowledge appropriate. Mood and affect appropriate.  Cranial Nerves: Fundoscopic exam reveals sharp disc margins. Pupils equal, briskly reactive to light. Extraocular movements full without nystagmus. Visual fields full to confrontation. Hearing intact. Facial sensation intact. Mild left lower facial asymmetry., tongue, palate moves normally and symmetrically.  Motor: Normal bulk and tone. Normal strength in all tested extremity muscles. Sensory.: intact to touch ,pinprick .position and vibratory sensation.  Coordination: Rapid alternating movements normal in all extremities. Finger-to-nose and heel-to-shin performed accurately bilaterally. Gait and Station: Arises from chair without difficulty. Stance is normal. Gait demonstrates normal stride length and balance . Able to heel, toe and tandem walk without difficulty.  Reflexes: 1+ and symmetric. Toes downgoing.   NIHSS 1 Modified Rankin 1   ASSESSMENT:  25 year Caucasian male with cryptogenic right MCA branch infarct in August 2018. Vascular risk factors of hyperlipidemia and PFO only.   PLAN: I had a long d/w patient and his wife about his recent cryptogenic stroke, risk for recurrent stroke/TIAs, personally independently reviewed imaging studies and stroke evaluation results and answered questions.Continue  aspirin 325 mg daily and discontinue Plavix as I do not see  benefit of to antiplatelet therapy beyond 3 weeks  for secondary stroke prevention and maintain strict control of hypertension with blood pressure goal below 130/90, diabetes with hemoglobin A1c goal below 6.5% and lipids with LDL cholesterol goal below 70 mg/dL. I also advised the patient to eat a healthy diet with plenty of whole grains, cereals, fruits and vegetables, exercise regularly and maintain ideal body weight. The patient has a small PFO but his ROPEscore is 5 which gives him a 34% chance that that stroke is due to PFO with a 7% risk of ecurrence of stroke/TIA over 2 years hence  I believe he needs medical management and not endovascular PFO closure. The patient has made significant recovery and he may return back to work without restrictions. He may also drive a motor vehicle without restrictions. He may consider possible participation in the [facility] stroke prevention trial if interested. He was given information to review Followup in the future with me in 6 months or call earlier if necessary Greater than 50% of time during this 25 minute visit was spent on counseling,explanation of diagnosis of cryptogenic stroke, PFO, planning of further management, discussion with patient and family and coordination of care Delia Heady, MD  [facility] [address]  [place], [place] [address]  Phone [phone] Fax [phone] Note: This document was prepared with digital dictation and possible smart phrase technology. Any transcriptional errors that result from this process are unintentional

## 2017-09-26 ENCOUNTER — Ambulatory Visit (INDEPENDENT_AMBULATORY_CARE_PROVIDER_SITE_OTHER): Payer: 59 | Admitting: *Deleted

## 2017-09-26 DIAGNOSIS — I639 Cerebral infarction, unspecified: Secondary | ICD-10-CM | POA: Diagnosis not present

## 2017-09-26 NOTE — Progress Notes (Signed)
Carelink Summary Report / Loop Recorder 

## 2017-09-29 LAB — CUP PACEART REMOTE DEVICE CHECK
Date Time Interrogation Session: 20181020193615
Implantable Pulse Generator Implant Date: 20180821

## 2017-10-24 ENCOUNTER — Ambulatory Visit (INDEPENDENT_AMBULATORY_CARE_PROVIDER_SITE_OTHER): Payer: 59 | Admitting: *Deleted

## 2017-10-24 DIAGNOSIS — I639 Cerebral infarction, unspecified: Secondary | ICD-10-CM | POA: Diagnosis not present

## 2017-10-25 NOTE — Progress Notes (Signed)
Carelink Summary Report / Loop Recorder 

## 2017-11-10 LAB — CUP PACEART REMOTE DEVICE CHECK
Date Time Interrogation Session: 20181119203651
MDC IDC PG IMPLANT DT: 20180821

## 2017-11-23 ENCOUNTER — Ambulatory Visit (INDEPENDENT_AMBULATORY_CARE_PROVIDER_SITE_OTHER): Payer: 59 | Admitting: *Deleted

## 2017-11-23 DIAGNOSIS — I639 Cerebral infarction, unspecified: Secondary | ICD-10-CM | POA: Diagnosis not present

## 2017-11-24 NOTE — Progress Notes (Signed)
Carelink Summary Report / Loop Recorder 

## 2017-12-08 LAB — CUP PACEART REMOTE DEVICE CHECK
Implantable Pulse Generator Implant Date: 20180821
MDC IDC SESS DTM: 20181219203854

## 2017-12-23 ENCOUNTER — Ambulatory Visit (INDEPENDENT_AMBULATORY_CARE_PROVIDER_SITE_OTHER): Payer: 59 | Admitting: *Deleted

## 2017-12-23 DIAGNOSIS — I639 Cerebral infarction, unspecified: Secondary | ICD-10-CM | POA: Diagnosis not present

## 2017-12-26 NOTE — Progress Notes (Signed)
Carelink Summary Report / Loop Recorder 

## 2017-12-27 LAB — CUP PACEART REMOTE DEVICE CHECK
Date Time Interrogation Session: 20190118210830
Implantable Pulse Generator Implant Date: 20180821

## 2017-12-28 ENCOUNTER — Telehealth: Payer: Self-pay | Admitting: Cardiology

## 2017-12-28 NOTE — Telephone Encounter (Signed)
Spoke w/ pt mother and requested that he send a manual transmission b/c his home monitor has not updated in at least 14 days.   

## 2017-12-29 ENCOUNTER — Telehealth: Payer: Self-pay | Admitting: Internal Medicine

## 2017-12-29 NOTE — Telephone Encounter (Signed)
LMOM to return call.

## 2017-12-29 NOTE — Telephone Encounter (Signed)
Mr.Shanks is calling about a transmission . Please call

## 2017-12-29 NOTE — Telephone Encounter (Signed)
Spoke w/ pt and instructed him to send a manual transmission and how to do it. Pt verbalized understanding.

## 2018-01-23 ENCOUNTER — Ambulatory Visit (INDEPENDENT_AMBULATORY_CARE_PROVIDER_SITE_OTHER): Payer: 59 | Admitting: *Deleted

## 2018-01-23 DIAGNOSIS — I639 Cerebral infarction, unspecified: Secondary | ICD-10-CM | POA: Diagnosis not present

## 2018-01-24 NOTE — Progress Notes (Signed)
Carelink Summary Report / Loop Recorder 

## 2018-02-20 LAB — CUP PACEART REMOTE DEVICE CHECK
Implantable Pulse Generator Implant Date: 20180821
MDC IDC SESS DTM: 20190218205754

## 2018-02-27 ENCOUNTER — Ambulatory Visit (INDEPENDENT_AMBULATORY_CARE_PROVIDER_SITE_OTHER): Payer: 59 | Admitting: *Deleted

## 2018-02-27 DIAGNOSIS — I639 Cerebral infarction, unspecified: Secondary | ICD-10-CM | POA: Diagnosis not present

## 2018-02-27 NOTE — Progress Notes (Signed)
Carelink Summary Report / Loop Recorder 

## 2018-03-14 ENCOUNTER — Ambulatory Visit: Payer: 59 | Admitting: Neurology

## 2018-03-14 ENCOUNTER — Encounter: Payer: Self-pay | Admitting: Neurology

## 2018-03-14 VITALS — BP 109/64 | HR 50 | Ht 67.0 in | Wt 175.2 lb

## 2018-03-14 DIAGNOSIS — G3184 Mild cognitive impairment, so stated: Secondary | ICD-10-CM | POA: Diagnosis not present

## 2018-03-14 NOTE — Progress Notes (Signed)
[facility] [address] [place]. [place] [id number] [phone]       OFFICE FOLLOW-UP NOTE  Mr. Luis BombardJoseph [name] Date of Birth:  03-21-1956 Medical Record Number:  [id number]   HPI: initial visit 09/13/2017:[name] is a 62 year old Caucasian male seen today for follow-up after recent hospitalization for stroke in August 2018. He is accompanied by his wife. History is obtained from the patient and review of electronic medical records and have personally viewed imaging findings. Luis LongsJoseph Vuncannonis an 62 y.o.malewithout any known medical conditions [facility] with complaints of left facial droop, slurred speech and dysphagia. [name] stated that he woke at 0600 and spoke to his mother without symptoms. He went back to bed and woke again at 0930 with the above symptoms. [facility] and [facility]. CT head 07/22/17: Possible small cortical infarct in the posterior right frontal lobe.CTA head/neck 07/22/17: Negative CTA of the head neck.Date last known well: Date: 07/22/2017.Time last known well: Time: 06:00 on 0/17/18.tPA Given:No:   minimal symptoms MRI scan of the brain showed a right posterior lateral frontal lobe acute to early subacute infarct. CT angiogram of the brain and neck showed no significant mass effect stenosis or occlusion. Transthoracic echo showed normal ejection fraction. Lower extremity venous Doppler showed no evidence of DVT. Tonsils echocardiogram showed small PFO cardiac source of embolism. LDL cholesterol elevated at 144 mg percent. Hemoglobin A1c was 5.7. Left lower follow-up vasculitis and hypercoagulable panel were both unremarkable. Urine drug screen was negative. Patient was felt to have a cryptogenic stroke and underwent loop recorder effacement. So far if it has not been found. He is tolerating aspirin and Plavix but complains of easy bruising. Patient states his made good physical recovery from his stroke but at  times his balance is off of antiplatelet and gets up quickly. Still has mild left facial droop. Patient wants to return to work. He wants to drive. He is tolerating Lipitor well without muscle aches and pains. His blood pressure is well controlled and today it is 119/71. He states he started eating healthy and he wants to be more active  And do regular exercise. Update 03/14/2018 : he returns for follow-up after last with 6 months ago. Continues to do well without recurrent stroke or TIA symptoms. He states his tolerating aspirin well without bleeding or bruising. His blood pressure is well controlled and today it is 109/64. His tolerating Lipitor without muscle aches and pains. He has not had follow-up lipid profile checked but plans to see his primary physician a few weeks and will check it there. He does complain of mild short-term memory difficulties that is noticed and is beginning to bother him. He has trouble remembering recent information and has to be told several times.I offered checking lab work for reversible causes of memory loss and profile and LFTs but he preferred that he wait till he sees his primary care physician and will have it done there.  ROS:   14 system review of systems is positive for  Fatigue, hearing loss, ringing in the ears, runny nose, eye itching, light sensitivity, constipation, allergies, memory loss, headache, joint and back pain, muscle cramps, shift work, anxiety and nervousness and all other systems negative PMH:  Past Medical History:  Diagnosis Date  . Hyperlipidemia   . Stroke [facility](H[facility])     Social History:  Social History   Socioeconomic History  . Marital status: Married    Spouse name: Not on file  .  Number of children: Not on file  . Years of education: Not on file  . Highest education level: Not on file  Occupational History  . Not on file  Social Needs  . Financial resource strain: Not on file  . Food insecurity:    Worry: Not on file    Inability:  Not on file  . Transportation needs:    Medical: Not on file    Non-medical: Not on file  Tobacco Use  . Smoking status: Former Smoker    Last attempt to quit: 12/06/1978    Years since quitting: 39.2  . Smokeless tobacco: Former Engineer, water and Sexual Activity  . Alcohol use: No  . Drug use: No  . Sexual activity: Not Currently  Lifestyle  . Physical activity:    Days per week: Not on file    Minutes per session: Not on file  . Stress: Not on file  Relationships  . Social connections:    Talks on phone: Not on file    Gets together: Not on file    Attends religious service: Not on file    Active member of club or organization: Not on file    Attends meetings of clubs or organizations: Not on file    Relationship status: Not on file  . Intimate partner violence:    Fear of current or ex partner: Not on file    Emotionally abused: Not on file    Physically abused: Not on file    Forced sexual activity: Not on file  Other Topics Concern  . Not on file  Social History Narrative  . Not on file    Medications:   Current Outpatient Medications on File Prior to Visit  Medication Sig Dispense Refill  . Ascorbic Acid (VITAMIN C PO) Take 1 tablet by mouth daily.    Marland Kitchen aspirin EC 325 MG EC tablet Take 1 tablet (325 mg total) by mouth daily. 30 tablet 0  . atorvastatin (LIPITOR) 80 MG tablet Take 1 tablet (80 mg total) by mouth daily at 6 PM. 30 tablet 0  . B Complex Vitamins (B COMPLEX 1 PO) Take 1 tablet by mouth daily.    . Cholecalciferol (VITAMIN D PO) Take 1,000 mg by mouth.    Marland Kitchen ibuprofen (ADVIL,MOTRIN) 200 MG tablet Take 400 mg by mouth every 6 (six) hours as needed for headache or mild pain.    Marland Kitchen LUTEIN PO Take 1 tablet by mouth daily.    . Multiple Vitamin (MULTIVITAMIN WITH MINERALS) TABS tablet Take 1 tablet by mouth daily.     No current facility-administered medications on file prior to visit.     Allergies:   Allergies  Allergen Reactions  . Flexeril  [Cyclobenzaprine] Nausea Only    Physical Exam General: well developed, well nourished middle-age Caucasian male, seated, in no evident distress Head: head normocephalic and atraumatic.  Neck: supple with no carotid or supraclavicular bruits Cardiovascular: regular rate and rhythm, no murmurs Musculoskeletal: no deformity Skin:  no rash/petichiae Vascular:  Normal pulses all extremities Vitals:   03/14/18 1032  BP: 109/64  Pulse: (!) 50   Neurologic Exam Mental Status: Awake and fully alert. Oriented to place and time. Recent and remote memory intact. Attention span, concentration and fund of knowledge appropriate. Mood and affect appropriate. Diminished recall 1/3. Animal naming test 12. Clock drawing 4/4. Cranial Nerves: Fundoscopic exam reveals sharp disc margins. Pupils equal, briskly reactive to light. Extraocular movements full without nystagmus. Visual fields full  to confrontation. Hearing intact. Facial sensation intact. Mild left lower facial asymmetry., tongue, palate moves normally and symmetrically.  Motor: Normal bulk and tone. Normal strength in all tested extremity muscles. Sensory.: intact to touch ,pinprick .position and vibratory sensation.  Coordination: Rapid alternating movements normal in all extremities. Finger-to-nose and heel-to-shin performed accurately bilaterally. Gait and Station: Arises from chair without difficulty. Stance is normal. Gait demonstrates normal stride length and balance . Able to heel, toe and tandem walk without difficulty.  Reflexes: 1+ and symmetric. Toes downgoing.       ASSESSMENT:  38 year Caucasian male with cryptogenic right MCA branch infarct in August 2018. Vascular risk factors of hyperlipidemia and PFO only.he is stable and doing well. New complaints of memory loss likely due to mild cognitive impairment   PLAN: I had a long discussion the patient and his wife regarding his new complaints of memory loss which I think is due to  mild cognitive impairment. I recommend he increase participation in cognitively challenging activities like playing bridge, crossword puzzles and sudoku. Continue fish oil and start either prevention or representative a trial for memory enhancement. We also discussed memory compensation strategies.Check lab work for reversible causes of memory loss as well as lipid profile. Continue aspirin for stroke prevention and strict control of lipids with LDL cholesterol goal below 70 mg percent.he may also consider possible participation in the [place] early dementia study if interested. He will be given information to review at home and decide He will return for follow-up in 3 months and see my nurse practitioner Shanda Bumps or call earlier if necessary. Greater than 50% of time during this 25 minute visit was spent on counseling,explanation of diagnosis of cryptogenic stroke, mild cognitive impairment, planning of further management, discussion with patient and family and coordination of care Delia Heady, MD  [facility] [address] [place], [place] [address]  Phone [phone] Fax [phone] Note: This document was prepared with digital dictation and possible smart phrase technology. Any transcriptional errors that result from this process are unintentional

## 2018-03-14 NOTE — Patient Instructions (Signed)
I had a long discussion the patient and his wife regarding his new complaints of memory loss which I think is due to mild cognitive impairment. I recommend he increase participation in cognitively challenging activities like playing bridge, crossword puzzles and sudoku. Continue fish oil and start either prevention or representative a trial for memory enhancement. We also discussed memory compensation strategies.Check lab work for reversible causes of memory loss as well as lipid profile. Continue aspirin for stroke prevention and strict control of lipids with LDL cholesterol goal below 70 mg percent.he may also consider possible participation in the [facility] early dementia study if interested. He will be given information to review at home and decide He will return for follow-up in 3 months and see my nurse practitioner Shanda BumpsJessica or call earlier if necessary. Memory Compensation Strategies  1. Use "WARM" strategy.  W= write it down  A= associate it  R= repeat it  M= make a mental note  2.   You can keep a Glass blower/designerMemory Notebook.  Use a 3-ring notebook with sections for the following: calendar, important names and phone numbers,  medications, doctors' names/phone numbers, lists/reminders, and a section to journal what you did  each day.   3.    Use a calendar to write appointments down.  4.    Write yourself a schedule for the day.  This can be placed on the calendar or in a separate section of the Memory Notebook.  Keeping a  regular schedule can help memory.  5.    Use medication organizer with sections for each day or morning/evening pills.  You may need help loading it  6.    Keep a basket, or pegboard by the door.  Place items that you need to take out with you in the basket or on the pegboard.  You may also want to  include a message board for reminders.  7.    Use sticky notes.  Place sticky notes with reminders in a place where the task is performed.  For example: " turn off the  stove"  placed by the stove, "lock the door" placed on the door at eye level, " take your medications" on  the bathroom mirror or by the place where you normally take your medications.  8.    Use alarms/timers.  Use while cooking to remind yourself to check on food or as a reminder to take your medicine, or as a  reminder to make a call, or as a reminder to perform another task, etc.

## 2018-03-30 ENCOUNTER — Ambulatory Visit (INDEPENDENT_AMBULATORY_CARE_PROVIDER_SITE_OTHER): Payer: 59 | Admitting: *Deleted

## 2018-03-30 DIAGNOSIS — I639 Cerebral infarction, unspecified: Secondary | ICD-10-CM | POA: Diagnosis not present

## 2018-03-31 NOTE — Progress Notes (Signed)
Carelink Summary Report / Loop Recorder 

## 2018-04-08 LAB — CUP PACEART REMOTE DEVICE CHECK
Date Time Interrogation Session: 20190323200931
Implantable Pulse Generator Implant Date: 20180821

## 2018-04-25 LAB — CUP PACEART REMOTE DEVICE CHECK
Implantable Pulse Generator Implant Date: 20180821
MDC IDC SESS DTM: 20190425203859

## 2018-05-02 ENCOUNTER — Ambulatory Visit (INDEPENDENT_AMBULATORY_CARE_PROVIDER_SITE_OTHER): Payer: 59 | Admitting: *Deleted

## 2018-05-02 DIAGNOSIS — I639 Cerebral infarction, unspecified: Secondary | ICD-10-CM | POA: Diagnosis not present

## 2018-05-03 NOTE — Progress Notes (Signed)
Carelink Summary Report / Loop Recorder 

## 2018-05-24 LAB — CUP PACEART REMOTE DEVICE CHECK
Date Time Interrogation Session: 20190528204052
MDC IDC PG IMPLANT DT: 20180821

## 2018-06-05 ENCOUNTER — Ambulatory Visit (INDEPENDENT_AMBULATORY_CARE_PROVIDER_SITE_OTHER): Payer: 59 | Admitting: *Deleted

## 2018-06-05 DIAGNOSIS — I639 Cerebral infarction, unspecified: Secondary | ICD-10-CM | POA: Diagnosis not present

## 2018-06-05 NOTE — Progress Notes (Signed)
Carelink Summary Report / Loop Recorder 

## 2018-06-14 ENCOUNTER — Ambulatory Visit: Payer: 59 | Admitting: Adult Health

## 2018-06-14 ENCOUNTER — Encounter: Payer: Self-pay | Admitting: Adult Health

## 2018-06-14 VITALS — BP 103/60 | HR 49 | Ht 67.0 in | Wt 178.0 lb

## 2018-06-14 DIAGNOSIS — I639 Cerebral infarction, unspecified: Secondary | ICD-10-CM | POA: Diagnosis not present

## 2018-06-14 DIAGNOSIS — F0631 Mood disorder due to known physiological condition with depressive features: Secondary | ICD-10-CM

## 2018-06-14 DIAGNOSIS — I69398 Other sequelae of cerebral infarction: Secondary | ICD-10-CM

## 2018-06-14 DIAGNOSIS — E785 Hyperlipidemia, unspecified: Secondary | ICD-10-CM | POA: Diagnosis not present

## 2018-06-14 MED ORDER — SERTRALINE HCL 25 MG PO TABS: 25.0000 mg | ORAL_TABLET | Freq: Every day | ORAL | 3 refills | Status: DC

## 2018-06-14 NOTE — Progress Notes (Signed)
I agree with the above plan 

## 2018-06-14 NOTE — Patient Instructions (Signed)
Continue aspirin 325 mg daily  and lipitor  for secondary stroke prevention  Start Zoloft 25mg  at night for depression - continue to follow up with your PCP regarding depression medication management and possible titration if needed   Continue to follow up with PCP regarding cholesterol management   Continue to monitor blood pressure at home  Maintain strict control of hypertension with blood pressure goal below 130/90, diabetes with hemoglobin A1c goal below 6.5% and cholesterol with LDL cholesterol (bad cholesterol) goal below 70 mg/dL. I also advised the patient to eat a healthy diet with plenty of whole grains, cereals, fruits and vegetables, exercise regularly and maintain ideal body weight.  Followup in the future with me in 6 months or call earlier if needed        Thank you for coming to [facility] at [facility]. I hope we have been able to provide you high quality care today.  You may receive a patient satisfaction survey over the next few weeks. We would appreciate your feedback and comments so that we may continue to improve ourselves and the health of our patients.

## 2018-06-14 NOTE — Progress Notes (Signed)
[facility] [address] [place]. [place] [address] [phone]       OFFICE FOLLOW-UP NOTE  Mr. Luis Luis [name] Date of Birth:  1956/02/10 Medical Record Number:  [id number]   HPI: initial visit 09/13/2017:Luis Luis [name] is a 62 year old Caucasian male seen today for follow-up after recent hospitalization for stroke in August 2018. He is accompanied by his wife. History is obtained from Luis Luis [name] and review of electronic medical records and have personally viewed imaging findings. Luis Vuncannonis an 62 y.o.malewithout any known medical conditions presented to [facility] with complaints of left facial droop, slurred speech and dysphagia. Luis Luis [name] stated that he woke at 0600 and spoke to his mother without symptoms. He went back to bed and woke again at 0930 with Luis above symptoms. He presented to [facility] for evaluation and treatment. CT head 07/22/17: Possible small cortical infarct in Luis posterior right frontal lobe.CTA head/neck 07/22/17: Negative CTA of Luis head neck.Date last known well: Date: 07/22/2017.Time last known well: Time: 06:00 on 0/17/18.tPA Given:No:   minimal symptoms MRI scan of Luis brain showed a right posterior lateral frontal lobe acute to early subacute infarct. CT angiogram of Luis brain and neck showed no significant mass effect stenosis or occlusion. Transthoracic echo showed normal ejection fraction. Lower extremity venous Doppler showed no evidence of DVT. Tonsils echocardiogram showed small PFO cardiac source of embolism. LDL cholesterol elevated at 144 mg percent. Hemoglobin A1c was 5.7. Left lower follow-up vasculitis and hypercoagulable panel were both unremarkable. Urine drug screen was negative. Luis [name] was felt to have a cryptogenic stroke and underwent loop recorder effacement. So far if it has not been found. He is tolerating aspirin and Plavix but complains of easy bruising. Luis [name] states his made good physical recovery from his stroke but at  times his balance is off of antiplatelet and gets up quickly. Still has mild left facial droop. Luis [name] wants to return to work. He wants to drive. He is tolerating Lipitor well without muscle aches and pains. His blood pressure is well controlled and today it is 119/71. He states he started eating healthy and he wants to be more active  And do regular exercise. 03/14/2018 visit PS: he returns for follow-up after last with 6 months ago. Continues to do well without recurrent stroke or TIA symptoms. He states his tolerating aspirin well without bleeding or bruising. His blood pressure is well controlled and today it is 109/64. His tolerating Lipitor without muscle aches and pains. He has not had follow-up lipid profile checked but plans to see his primary physician a few weeks and will check it there. He does complain of mild short-term memory difficulties that is noticed and is beginning to bother him. He has trouble remembering recent information and has to be told several times.I offered checking lab work for reversible causes of memory loss and profile and LFTs but he preferred that he wait till he sees his primary care physician and will have it done there.  06/14/18 UPDATE: Luis [name] returns today for 94-month follow-up visit and was accompanied by his sister.  At previous appointment, Luis [name] was complaining of increased memory loss but states this has since been improving.  He does do suduku daily along with at times crossword puzzles.  His PCP did follow-up on lab testing and was told that all levels were normal.  Luis [name] has recently started complain of increased fatigue and having no energy to do Luis activities he used to like or needed activities any to be done  around Luis house.  Luis [name] denies history of depression in Luis past but does have family history is sister states she is currently taking Zoloft..  Sister states that he has been frequently tearful and does not do much during Luis day until he has to  work.  He has been having some depression-like symptoms since his stroke but feels as a last few months Luis symptoms have been increasing.  Stated that his father recently passed in April and both him and his sister became tearful during appointment.  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  Continues to take aspirin without side effects of bleeding or bruising along with Lipitor without side effects myalgias for secondary stroke prevention.  Loop recorder for negative atrial fibrillation thus far.  Denies new or worsening stroke/TIA symptoms at this time.    ROS:   14 system review of systems is positive for fatigue, light sensitivity and environmental allergies and all other systems negative   PMH:  Past Medical History:  Diagnosis Date  . Hyperlipidemia   . Stroke [facility])     Social History:  Social History   Socioeconomic History  . Marital status: Married    Spouse name: Not on file  . Number of children: Not on file  . Years of education: Not on file  . Highest education level: Not on file  Occupational History  . Not on file  Social Needs  . Financial resource strain: Not on file  . Food insecurity:    Worry: Not on file    Inability: Not on file  . Transportation needs:    Medical: Not on file    Non-medical: Not on file  Tobacco Use  . Smoking status: Former Smoker    Last attempt to quit: 12/06/1978    Years since quitting: 39.5  . Smokeless tobacco: Former Engineer, water and Sexual Activity  . Alcohol use: No  . Drug use: No  . Sexual activity: Not Currently  Lifestyle  . Physical activity:    Days per week: Not on file    Minutes per session: Not on file  . Stress: Not on file  Relationships  . Social connections:    Talks on phone: Not on file    Gets together: Not on file    Attends religious service: Not on file    Active member of club or organization: Not on file    Attends meetings of clubs or organizations: Not on file     Relationship status: Not on file  . Intimate partner violence:    Fear of current or ex partner: Not on file    Emotionally abused: Not on file    Physically abused: Not on file    Forced sexual activity: Not on file  Other Topics Concern  . Not on file  Social History Narrative  . Not on file    Medications:   Current Outpatient Medications on File Prior to Visit  Medication Sig Dispense Refill  . Ascorbic Acid (VITAMIN C PO) Take 1 tablet by mouth daily.    Marland Kitchen aspirin EC 325 MG EC tablet Take 1 tablet (325 mg total) by mouth daily. 30 tablet 0  . atorvastatin (LIPITOR) 80 MG tablet     . B Complex Vitamins (B COMPLEX 1 PO) Take 1 tablet by mouth daily.    . Cholecalciferol (VITAMIN D PO) Take 1,000 mg by mouth.    Marland Kitchen ibuprofen (ADVIL,MOTRIN) 200  MG tablet Take 400 mg by mouth every 6 (six) hours as needed for headache or mild pain.    Marland Kitchen LUTEIN PO Take 1 tablet by mouth daily.    . Multiple Vitamin (MULTIVITAMIN WITH MINERALS) TABS tablet Take 1 tablet by mouth daily.    . Omega-3 Fatty Acids (FISH OIL) 1200 MG CAPS Take 1 capsule by mouth daily.     No current facility-administered medications on file prior to visit.     Allergies:   Allergies  Allergen Reactions  . Flexeril [Cyclobenzaprine] Nausea Only    Physical Exam General: well developed, well nourished middle-age Caucasian male, seated, in no evident distress Depression screen PHQ 2/9 06/14/2018  Decreased Interest 1  Down, Depressed, Hopeless 2  PHQ - 2 Score 3  Altered sleeping 2  Tired, decreased energy 3  Change in appetite 1  Feeling bad or failure about yourself  1  Trouble concentrating 1  Moving slowly or fidgety/restless 1  Suicidal thoughts 0  PHQ-9 Score 12  Difficult doing work/chores Somewhat difficult   Head: head normocephalic and atraumatic.  Neck: supple with no carotid or supraclavicular bruits Cardiovascular: regular rate and rhythm, no murmurs Musculoskeletal: no deformity Skin:  no  rash/petichiae Vascular:  Normal pulses all extremities  Vitals:   06/14/18 0905  BP: 103/60  Pulse: (!) 49   Neurologic Exam Mental Status: Awake and fully alert. Oriented to place and time. Recent and remote memory intact. Attention span, concentration and fund of knowledge appropriate. Mood and affect appropriate but tearful at times during appointment.  Cranial Nerves: Fundoscopic exam reveals sharp disc margins. Pupils equal, briskly reactive to light. Extraocular movements full without nystagmus. Visual fields full to confrontation. Hearing intact. Facial sensation intact. Mild left lower facial asymmetry., tongue, palate moves normally and symmetrically.  Motor: Normal bulk and tone. Normal strength in all tested extremity muscles. Sensory.: intact to touch ,pinprick .position and vibratory sensation.  Coordination: Rapid alternating movements normal in all extremities. Finger-to-nose and heel-to-shin performed accurately bilaterally. Gait and Station: Arises from chair without difficulty. Stance is normal. Gait demonstrates normal stride length and balance . Able to heel, toe and tandem walk without difficulty.  Reflexes: 1+ and symmetric. Toes downgoing.     ASSESSMENT:  21 year Caucasian male with cryptogenic right MCA branch infarct in August 2018. Vascular risk factors of hyperlipidemia and PFO only.he is stable and doing well.  Being seen today for follow-up visit and states his memory has improved but no complaints of in increased fatigue.   PLAN: -Continue aspirin 81 mg daily  and Lipitor for secondary stroke prevention -Start Zoloft 25 mg for depression with symptoms of increased fatigue and lack of energy/motivation (PHQ 9 12) and recommended for continuation with PCP in regards to possible titration and management of this medication -F/u with PCP regarding your HLD management -continue to monitor BP at home -Continue daily to suduku and mind challenging  exercises -Maintain strict control of hypertension with blood pressure goal below 130/90, diabetes with hemoglobin A1c goal below 6.5% and cholesterol with LDL cholesterol (bad cholesterol) goal below 70 mg/dL. I also advised Luis Luis [name] to eat a healthy diet with plenty of whole grains, cereals, fruits and vegetables, exercise regularly and maintain ideal body weight.  Follow up in 6 months or call earlier if needed  Greater than 50% of time during this 25 minute visit was spent on counseling,explanation of diagnosis of cryptogenic stroke, mild cognitive impairment, planning of further management, discussion with Luis [name]  and family and coordination of care cess are unintentional  George HughJessica VanSchaick, [facility] Medical CenterGNP-BC  [facility] [address] [place], [place] [id number]  Phone [phone] Fax [phone] Note: This document was prepared with digital dictation and possible smart phrase technology. Any transcriptional errors that result from this process are unintentional.

## 2018-07-04 LAB — CUP PACEART REMOTE DEVICE CHECK
Implantable Pulse Generator Implant Date: 20180821
MDC IDC SESS DTM: 20190630213521

## 2018-07-07 ENCOUNTER — Ambulatory Visit (INDEPENDENT_AMBULATORY_CARE_PROVIDER_SITE_OTHER): Payer: 59 | Admitting: *Deleted

## 2018-07-07 DIAGNOSIS — I639 Cerebral infarction, unspecified: Secondary | ICD-10-CM | POA: Diagnosis not present

## 2018-07-10 NOTE — Progress Notes (Signed)
Carelink Summary Report / Loop Recorder 

## 2018-08-09 ENCOUNTER — Ambulatory Visit (INDEPENDENT_AMBULATORY_CARE_PROVIDER_SITE_OTHER): Payer: 59 | Admitting: *Deleted

## 2018-08-09 DIAGNOSIS — I639 Cerebral infarction, unspecified: Secondary | ICD-10-CM

## 2018-08-10 NOTE — Progress Notes (Signed)
Carelink Summary Report / Loop Recorder 

## 2018-08-17 LAB — CUP PACEART REMOTE DEVICE CHECK
Date Time Interrogation Session: 20190802213842
MDC IDC PG IMPLANT DT: 20180821

## 2018-09-01 LAB — CUP PACEART REMOTE DEVICE CHECK
Date Time Interrogation Session: 20190904220745
MDC IDC PG IMPLANT DT: 20180821

## 2018-09-11 ENCOUNTER — Ambulatory Visit (INDEPENDENT_AMBULATORY_CARE_PROVIDER_SITE_OTHER): Payer: 59 | Admitting: *Deleted

## 2018-09-11 DIAGNOSIS — I639 Cerebral infarction, unspecified: Secondary | ICD-10-CM

## 2018-09-13 NOTE — Progress Notes (Signed)
Carelink Summary Report / Loop Recorder 

## 2018-09-25 LAB — CUP PACEART REMOTE DEVICE CHECK
Date Time Interrogation Session: 20191007223943
Implantable Pulse Generator Implant Date: 20180821

## 2018-10-16 ENCOUNTER — Ambulatory Visit (INDEPENDENT_AMBULATORY_CARE_PROVIDER_SITE_OTHER): Payer: 59 | Admitting: *Deleted

## 2018-10-16 DIAGNOSIS — I639 Cerebral infarction, unspecified: Secondary | ICD-10-CM | POA: Diagnosis not present

## 2018-10-16 DIAGNOSIS — R55 Syncope and collapse: Secondary | ICD-10-CM

## 2018-10-17 NOTE — Progress Notes (Signed)
Carelink Summary Report / Loop Recorder 

## 2018-11-16 ENCOUNTER — Ambulatory Visit (INDEPENDENT_AMBULATORY_CARE_PROVIDER_SITE_OTHER): Payer: BLUE CROSS/BLUE SHIELD

## 2018-11-16 DIAGNOSIS — I639 Cerebral infarction, unspecified: Secondary | ICD-10-CM

## 2018-11-17 NOTE — Progress Notes (Signed)
Carelink Summary Report / Loop Recorder 

## 2018-12-05 LAB — CUP PACEART REMOTE DEVICE CHECK
MDC IDC PG IMPLANT DT: 20180821
MDC IDC SESS DTM: 20191110010842

## 2018-12-14 NOTE — Progress Notes (Signed)
[facility] [address] [place]. [place] [address] [phone]       OFFICE FOLLOW-UP NOTE  [name] Date of Birth:  [birth year] Medical Record Number:  [id number]   Chief Complaint  Patient presents with  . Follow-up    6 month follow up. Sister present. Rm 9. No new concerns at this time.     HPI: initial visit 09/13/2017:Luis [name] is a 63 year old Caucasian male seen today for follow-up after recent hospitalization for stroke in August 2018. He is accompanied by his wife. History is obtained from the patient and review of electronic medical records and have personally viewed imaging findings. Luis [name] an 63 y.o.malewithout any known medical conditions presented to [facility] with complaints of left facial droop, slurred speech and dysphagia. Luis [name] stated that he woke at 0600 and spoke to his mother without symptoms. He went back to bed and woke again at 0930 with the above symptoms. He presented to [facility] for evaluation and treatment. CT head 07/22/17: Possible small cortical infarct in the posterior right frontal lobe.CTA head/neck 07/22/17: Negative CTA of the head neck.Date last known well: Date: 07/22/2017.Time last known well: Time: 06:00 on 0/17/18.tPA Given:No:   minimal symptoms MRI scan of the brain showed a right posterior lateral frontal lobe acute to early subacute infarct. CT angiogram of the brain and neck showed no significant mass effect stenosis or occlusion. Transthoracic echo showed normal ejection fraction. Lower extremity venous Doppler showed no evidence of DVT. Tonsils echocardiogram showed small PFO cardiac source of embolism. LDL cholesterol elevated at 144 mg percent. Hemoglobin A1c was 5.7. Left lower follow-up vasculitis and hypercoagulable panel were both unremarkable. Urine drug screen was negative. Patient was felt to have a cryptogenic stroke and underwent loop recorder effacement. So far if it has not been found.  He is tolerating aspirin and Plavix but complains of easy bruising. Patient states his made good physical recovery from his stroke but at times his balance is off of antiplatelet and gets up quickly. Still has mild left facial droop. Patient wants to return to work. He wants to drive. He is tolerating Lipitor well without muscle aches and pains. His blood pressure is well controlled and today it is 119/71. He states he started eating healthy and he wants to be more active  And do regular exercise.  03/14/2018 visit PS: he returns for follow-up after last with 6 months ago. Continues to do well without recurrent stroke or TIA symptoms. He states his tolerating aspirin well without bleeding or bruising. His blood pressure is well controlled and today it is 109/64. His tolerating Lipitor without muscle aches and pains. He has not had follow-up lipid profile checked but plans to see his primary physician a few weeks and will check it there. He does complain of mild short-term memory difficulties that is noticed and is beginning to bother him. He has trouble remembering recent information and has to be told several times.I offered checking lab work for reversible causes of memory loss and profile and LFTs but he preferred that he wait till he sees his primary care physician and will have it done there.  06/14/18 visit: Patient returns today for 73-month follow-up visit and was accompanied by his sister.  At previous appointment, patient was complaining of increased memory loss but states this has since been improving.  He does do suduku daily along with at times crossword puzzles.  His PCP did follow-up on lab testing and was told that all  levels were normal.  Patient has recently started complain of increased fatigue and having no energy to do the activities he used to like or needed activities any to be done around the house.  Patient denies history of depression in the past but does have family history is sister  states she is currently taking Zoloft..  Sister states that he has been frequently tearful and does not do much during the day until he has to work.  He has been having some depression-like symptoms since his stroke but feels as a last few months the symptoms have been increasing.  Stated that his father recently passed in April and both him and his sister became tearful during appointment.  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  Continues to take aspirin without side effects of bleeding or bruising along with Lipitor without side effects myalgias for secondary stroke prevention.  Loop recorder for negative atrial fibrillation thus far.  Denies new or worsening stroke/TIA symptoms at this time.  Interval history 12/15/2018: Patient is being seen today for 50-month follow-up visit and is accompanied by his sister.  He does endorse continued short-term memory loss but overall has been stable and is able to continue working at a metal factory without difficulties.  Due to depression symptoms that were discussed at prior visit, Zoloft was initiated and had been increased by PCP to 100 mg daily.  Approximately 1.5 weeks ago, patient discontinued this medication on his own as he felt as though it was no longer needed.  He also felt as though it was not making any type of difference and also could not remember what he was taking it for.  PHQ 9 today 6.  Upon further discussion, patient states that he has continued difficulties with completing tasks as he does not have the energy or ambition to do so.  He also states that in December, he got behind on bills where he had to have his sister financially assist him.  He does state that he was able to pay the bills financially but building became overwhelming for him therefore he stopped doing it.  He became tearful during appointment but was discussing this.  He consistently blamed these feelings on being "lazy", old age, and having a stroke. Continues  on aspirin without side effects of bleeding or bruising.  Continues on atorvastatin without side effects myalgias.  Blood pressure today 112/65.  Loop recorder negative for atrial fibrillation thus far.  No further concerns at this time.  Denies new or worsening stroke/TIA symptoms.     ROS:   14 system review of systems is positive for hearing loss, ringing in ears, constipation, joint pain, and memory loss and all other systems negative   PMH:  Past Medical History:  Diagnosis Date  . Hyperlipidemia   . Stroke [facility])     Social History:  Social History   Socioeconomic History  . Marital status: Married    Spouse name: Not on file  . Number of children: Not on file  . Years of education: Not on file  . Highest education level: Not on file  Occupational History  . Not on file  Social Needs  . Financial resource strain: Not on file  . Food insecurity:    Worry: Not on file    Inability: Not on file  . Transportation needs:    Medical: Not on file    Non-medical: Not on file  Tobacco Use  .  Smoking status: Former Smoker    Last attempt to quit: 12/06/1978    Years since quitting: 40.0  . Smokeless tobacco: Former Engineer, water and Sexual Activity  . Alcohol use: No  . Drug use: No  . Sexual activity: Not Currently  Lifestyle  . Physical activity:    Days per week: Not on file    Minutes per session: Not on file  . Stress: Not on file  Relationships  . Social connections:    Talks on phone: Not on file    Gets together: Not on file    Attends religious service: Not on file    Active member of club or organization: Not on file    Attends meetings of clubs or organizations: Not on file    Relationship status: Not on file  . Intimate partner violence:    Fear of current or ex partner: Not on file    Emotionally abused: Not on file    Physically abused: Not on file    Forced sexual activity: Not on file  Other Topics Concern  . Not on file  Social History  Narrative  . Not on file    Medications:   Current Outpatient Medications on File Prior to Visit  Medication Sig Dispense Refill  . Ascorbic Acid (VITAMIN C PO) Take 1 tablet by mouth daily.    Marland Kitchen aspirin EC 325 MG EC tablet Take 1 tablet (325 mg total) by mouth daily. 30 tablet 0  . atorvastatin (LIPITOR) 80 MG tablet     . B Complex Vitamins (B COMPLEX 1 PO) Take 1 tablet by mouth daily.    . Cholecalciferol (VITAMIN D PO) Take 1,000 mg by mouth.    Marland Kitchen ibuprofen (ADVIL,MOTRIN) 200 MG tablet Take 400 mg by mouth every 6 (six) hours as needed for headache or mild pain.    Marland Kitchen LUTEIN PO Take 1 tablet by mouth daily.    . Multiple Vitamin (MULTIVITAMIN WITH MINERALS) TABS tablet Take 1 tablet by mouth daily.    . Omega-3 Fatty Acids (FISH OIL) 1200 MG CAPS Take 1 capsule by mouth daily.    . sertraline (ZOLOFT) 100 MG tablet Take 100 mg by mouth daily.     No current facility-administered medications on file prior to visit.     Allergies:   Allergies  Allergen Reactions  . Flexeril [Cyclobenzaprine] Nausea Only    Physical Exam General: well developed, well nourished middle-age Caucasian male, seated, in no evident distress Head: head normocephalic and atraumatic.  Neck: supple with no carotid or supraclavicular bruits Cardiovascular: regular rate and rhythm, no murmurs Musculoskeletal: no deformity Skin:  no rash/petichiae Vascular:  Normal pulses all extremities  Vitals:   12/15/18 0913  BP: 112/65  Pulse: (!) 51    Depression screen [facility] 2/9 12/15/2018 12/15/2018 06/14/2018  Decreased Interest 2 0 1  Down, Depressed, Hopeless 0 0 2  PHQ - 2 Score 2 0 3  Altered sleeping 1 - 2  Tired, decreased energy 2 - 3  Change in appetite 0 - 1  Feeling bad or failure about yourself  0 - 1  Trouble concentrating 0 - 1  Moving slowly or fidgety/restless 1 - 1  Suicidal thoughts 0 - 0  PHQ-9 Score 6 - 12  Difficult doing work/chores Somewhat difficult - Somewhat difficult      Neurologic Exam Mental Status: Awake and fully alert. Oriented to place and time. Remote memory intact. Attention span, concentration and fund of knowledge  appropriate. Mood and affect appropriate but tearful at times during appointment.  Cranial Nerves: Pupils equal, briskly reactive to light. Extraocular movements full without nystagmus. Visual fields full to confrontation. Hearing intact. Facial sensation intact. Mild left lower facial asymmetry., tongue, palate moves normally and symmetrically.  Motor: Normal bulk and tone. Normal strength in all tested extremity muscles. Sensory.: intact to touch ,pinprick .position and vibratory sensation.  Coordination: Rapid alternating movements normal in all extremities. Finger-to-nose and heel-to-shin performed accurately bilaterally. Gait and Station: Arises from chair without difficulty. Stance is normal. Gait demonstrates normal stride length and balance . Able to heel, toe and tandem walk without difficulty.  Reflexes: 1+ and symmetric. Toes downgoing.     ASSESSMENT:  [age] year Caucasian male with cryptogenic right MCA branch infarct in August 2018. Vascular risk factors of hyperlipidemia and PFO only.he is stable and doing well.  Patient is being seen today for scheduled follow-up visit and does endorse continued short-term memory loss but otherwise has been stable from a stroke standpoint.  He also continues to express depression associated symptoms.   PLAN: -Continue aspirin 325 mg daily  and Lipitor for secondary stroke prevention -Initiate Lexapro 10 mg daily along with continuation of Zoloft 100 mg daily.  Advised patient to continue to follow with PCP for depression management and highly encourage compliance with these medications -F/u with PCP regarding your HLD management -continue to monitor BP at home -Continue daily to suduku and mind challenging exercises -Maintain strict control of hypertension with blood pressure goal below  130/90, diabetes with hemoglobin A1c goal below 6.5% and cholesterol with LDL cholesterol (bad cholesterol) goal below 70 mg/dL. I also advised the patient to eat a healthy diet with plenty of whole grains, cereals, fruits and vegetables, exercise regularly and maintain ideal body weight.  Patient is stable from a stroke standpoint and recommended follow-up as needed as patient does follow closely with PCP for depression management.  As PCP is currently on maternity leave, initiated additional antidepressant for continued symptoms.  Advised patient and sister to call office with any questions, concerns or need of future follow-up appointment.  Greater than 50% of time during this 25 minute visit was spent on counseling,explanation of diagnosis of cryptogenic stroke, mild cognitive impairment, review of depression with associated symptoms, planning of further management, discussion with patient and family and coordination of care   Luis HughJessica , AGNP-BC  [facility] [address] [place], [place] [id number]  Phone [phone] Fax [phone] Note: This document was prepared with digital dictation and possible smart phrase technology. Any transcriptional errors that result from this process are unintentional.

## 2018-12-15 ENCOUNTER — Encounter: Payer: Self-pay | Admitting: Adult Health

## 2018-12-15 ENCOUNTER — Ambulatory Visit: Payer: 59 | Admitting: Adult Health

## 2018-12-15 VITALS — BP 112/65 | HR 51 | Ht 67.0 in | Wt 171.2 lb

## 2018-12-15 DIAGNOSIS — E785 Hyperlipidemia, unspecified: Secondary | ICD-10-CM

## 2018-12-15 DIAGNOSIS — F0631 Mood disorder due to known physiological condition with depressive features: Secondary | ICD-10-CM | POA: Diagnosis not present

## 2018-12-15 DIAGNOSIS — I69398 Other sequelae of cerebral infarction: Secondary | ICD-10-CM | POA: Diagnosis not present

## 2018-12-15 DIAGNOSIS — I639 Cerebral infarction, unspecified: Secondary | ICD-10-CM

## 2018-12-15 MED ORDER — SERTRALINE HCL 100 MG PO TABS: 100.0000 mg | ORAL_TABLET | Freq: Every day | ORAL | 2 refills | Status: AC

## 2018-12-15 MED ORDER — ESCITALOPRAM OXALATE 10 MG PO TABS: 10.0000 mg | ORAL_TABLET | Freq: Every day | ORAL | 3 refills | Status: AC

## 2018-12-15 NOTE — Patient Instructions (Addendum)
Continue aspirin 325 mg daily  and Lipitor for secondary stroke prevention  Continue to follow up with PCP regarding cholesterol and blood pressure management   Start Lexapro 10mg  daily for continued depression. Follow up with PCP for continued management. Please also continue to take Zoloft as prescribed   Continue to stay active and maintain a healthy diet  Continue to monitor blood pressure at home  Maintain strict control of hypertension with blood pressure goal below 130/90, diabetes with hemoglobin A1c goal below 6.5% and cholesterol with LDL cholesterol (bad cholesterol) goal below 70 mg/dL. I also advised the patient to eat a healthy diet with plenty of whole grains, cereals, fruits and vegetables, exercise regularly and maintain ideal body weight.  Followup in the future with me as needed or call with questions or concerns        Thank you for coming to see [place] at [facility]. I hope we have been able to provide you high quality care today.  You may receive a patient satisfaction survey over the next few weeks. We would appreciate your feedback and comments so that we may continue to improve ourselves and the health of our patients.

## 2018-12-19 ENCOUNTER — Ambulatory Visit (INDEPENDENT_AMBULATORY_CARE_PROVIDER_SITE_OTHER): Payer: BLUE CROSS/BLUE SHIELD

## 2018-12-19 DIAGNOSIS — I639 Cerebral infarction, unspecified: Secondary | ICD-10-CM

## 2018-12-20 NOTE — Progress Notes (Signed)
Carelink Summary Report / Loop Recorder 

## 2018-12-22 LAB — CUP PACEART REMOTE DEVICE CHECK
Implantable Pulse Generator Implant Date: 20180821
MDC IDC SESS DTM: 20200115013935

## 2018-12-28 ENCOUNTER — Encounter: Payer: Self-pay | Admitting: Family Medicine

## 2018-12-28 LAB — CUP PACEART REMOTE DEVICE CHECK
Date Time Interrogation Session: 20191213011004
Implantable Pulse Generator Implant Date: 20180821

## 2019-01-22 ENCOUNTER — Ambulatory Visit (INDEPENDENT_AMBULATORY_CARE_PROVIDER_SITE_OTHER): Payer: BLUE CROSS/BLUE SHIELD

## 2019-01-22 DIAGNOSIS — I639 Cerebral infarction, unspecified: Secondary | ICD-10-CM

## 2019-01-23 LAB — CUP PACEART REMOTE DEVICE CHECK
Date Time Interrogation Session: 20200217023955
Implantable Pulse Generator Implant Date: 20180821

## 2019-01-25 DIAGNOSIS — R413 Other amnesia: Secondary | ICD-10-CM | POA: Diagnosis not present

## 2019-01-25 DIAGNOSIS — R5383 Other fatigue: Secondary | ICD-10-CM | POA: Diagnosis not present

## 2019-01-25 DIAGNOSIS — E785 Hyperlipidemia, unspecified: Secondary | ICD-10-CM | POA: Diagnosis not present

## 2019-01-25 DIAGNOSIS — F329 Major depressive disorder, single episode, unspecified: Secondary | ICD-10-CM | POA: Diagnosis not present

## 2019-01-25 DIAGNOSIS — G459 Transient cerebral ischemic attack, unspecified: Secondary | ICD-10-CM | POA: Diagnosis not present

## 2019-01-25 DIAGNOSIS — Z79899 Other long term (current) drug therapy: Secondary | ICD-10-CM | POA: Diagnosis not present

## 2019-01-31 NOTE — Progress Notes (Signed)
Carelink Summary Report / Loop Recorder 

## 2019-02-23 ENCOUNTER — Other Ambulatory Visit: Payer: Self-pay

## 2019-02-23 ENCOUNTER — Ambulatory Visit (INDEPENDENT_AMBULATORY_CARE_PROVIDER_SITE_OTHER): Payer: BLUE CROSS/BLUE SHIELD | Admitting: *Deleted

## 2019-02-23 DIAGNOSIS — I639 Cerebral infarction, unspecified: Secondary | ICD-10-CM | POA: Diagnosis not present

## 2019-02-24 LAB — CUP PACEART REMOTE DEVICE CHECK
Date Time Interrogation Session: 20200321033731
MDC IDC PG IMPLANT DT: 20180821

## 2019-03-02 NOTE — Progress Notes (Signed)
Carelink Summary Report / Loop Recorder 

## 2019-03-09 DIAGNOSIS — F329 Major depressive disorder, single episode, unspecified: Secondary | ICD-10-CM | POA: Diagnosis not present

## 2019-03-29 ENCOUNTER — Other Ambulatory Visit: Payer: Self-pay

## 2019-03-29 ENCOUNTER — Ambulatory Visit (INDEPENDENT_AMBULATORY_CARE_PROVIDER_SITE_OTHER): Payer: BLUE CROSS/BLUE SHIELD | Admitting: *Deleted

## 2019-03-29 DIAGNOSIS — I639 Cerebral infarction, unspecified: Secondary | ICD-10-CM

## 2019-03-29 LAB — CUP PACEART REMOTE DEVICE CHECK
Date Time Interrogation Session: 20200423101019
Implantable Pulse Generator Implant Date: 20180821

## 2019-04-06 NOTE — Progress Notes (Signed)
Carelink Summary Report / Loop Recorder 

## 2019-05-02 ENCOUNTER — Ambulatory Visit (INDEPENDENT_AMBULATORY_CARE_PROVIDER_SITE_OTHER): Payer: BLUE CROSS/BLUE SHIELD | Admitting: *Deleted

## 2019-05-02 DIAGNOSIS — I639 Cerebral infarction, unspecified: Secondary | ICD-10-CM | POA: Diagnosis not present

## 2019-05-02 LAB — CUP PACEART REMOTE DEVICE CHECK
Date Time Interrogation Session: 20200526100629
Implantable Pulse Generator Implant Date: 20180821

## 2019-05-09 IMAGING — MR MR HEAD W/O CM
8 of 10 series · 35 of 48 positions shown · non-contrast
Comparison: 07/22/2017 CT angiogram of the head and CT of the head.

CLINICAL DATA: 61 y/o  M; slurred speech and left facial drooping.

EXAM:
MRI HEAD WITHOUT CONTRAST
TECHNIQUE: Multiplanar, multiecho pulse sequences of the brain and surrounding
structures were obtained without intravenous contrast.

[Series 3: DWI · axial · 3.0mm · 1.09mm/px · z∈[-65,+67]mm · 9 of 94 slices shown (1 of 4)]
[im 1/94]
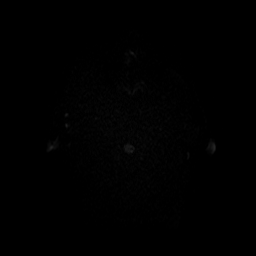
[im 12/94]
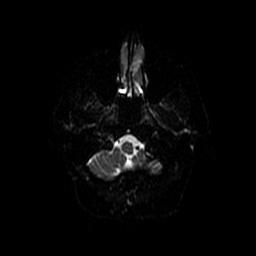
[im 24/94]
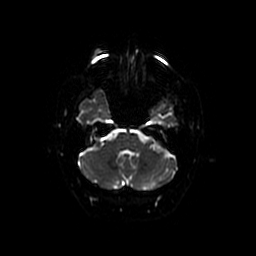
[im 35/94]
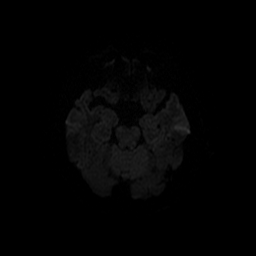
[im 47/94]
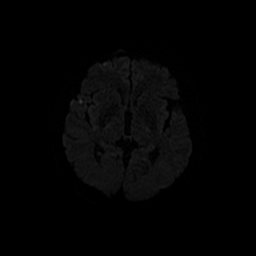
[im 59/94]
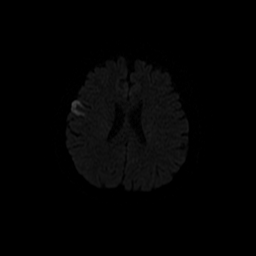
[im 70/94]
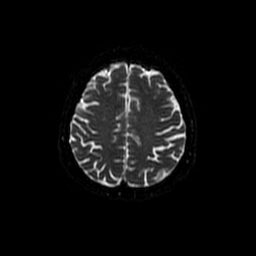
[im 82/94]
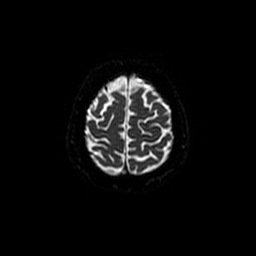
[im 94/94]
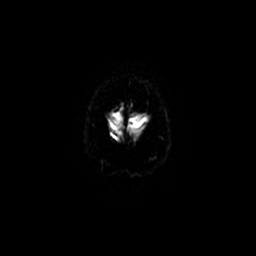

[Series 4: DWI · coronal · 5.0mm · 1.09mm/px · 7 of 66 slices shown (2 of 4)]
[im 1/66]
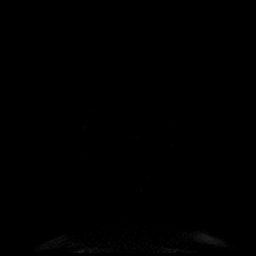
[im 11/66]
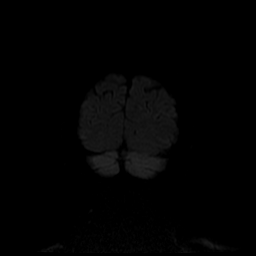
[im 22/66]
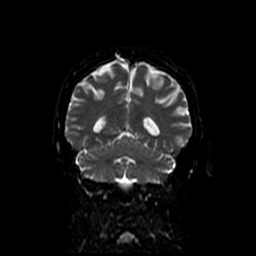
[im 33/66]
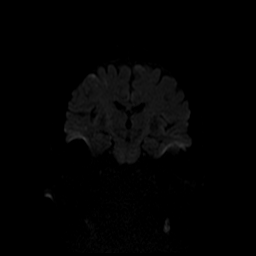
[im 44/66]
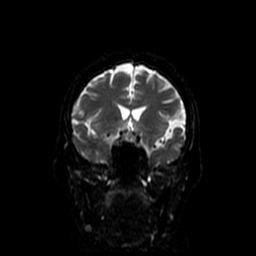
[im 55/66]
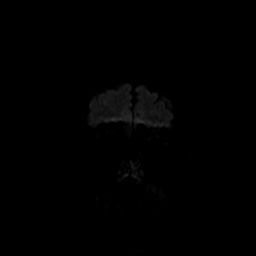
[im 66/66]
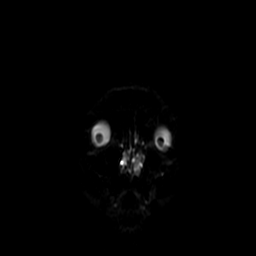

[Series 5: T1 · sagittal · 5.0mm · 0.47mm/px · 2 of 23 slices shown]
[im 1/23]
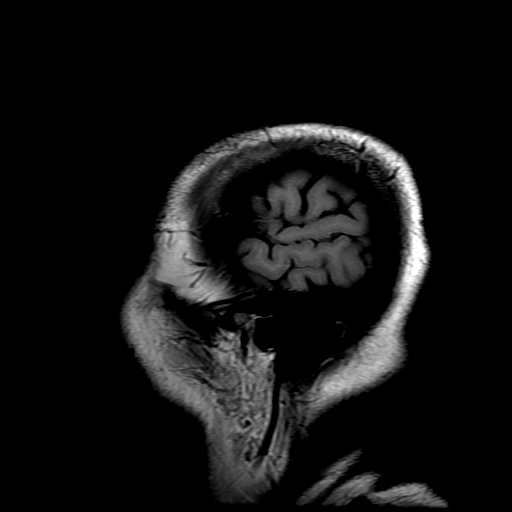
[im 23/23]
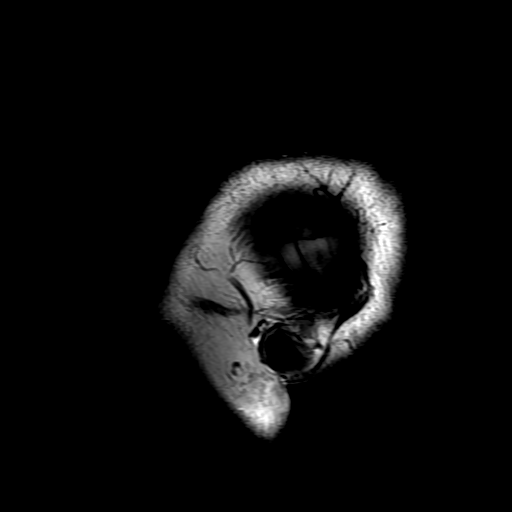

[Series 6: T2 · axial · 5.0mm · 0.43mm/px · z∈[-76,+55]mm · 3 of 24 slices shown (1 of 2)]
[im 1/24]
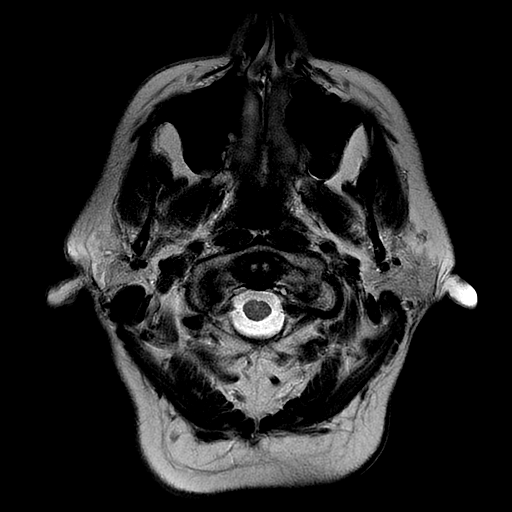
[im 12/24]
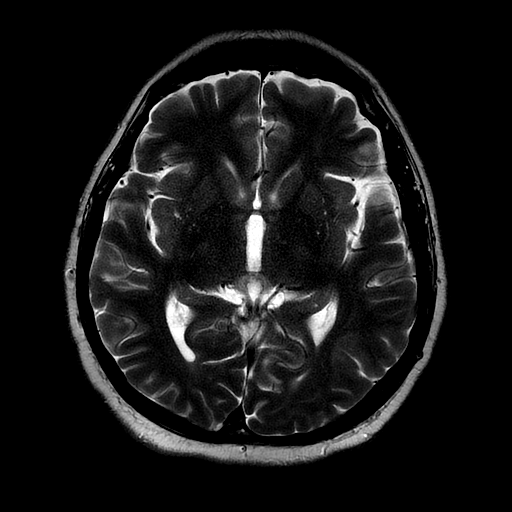
[im 24/24]
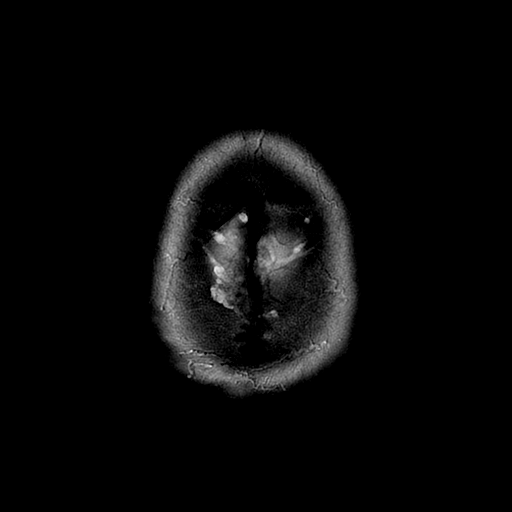

[Series 7: FLAIR · axial · 5.0mm · 0.43mm/px · z∈[-76,+55]mm · 3 of 24 slices shown]
[im 1/24]
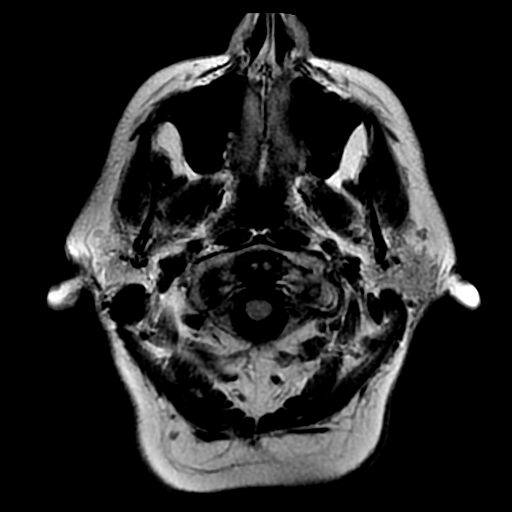
[im 12/24]
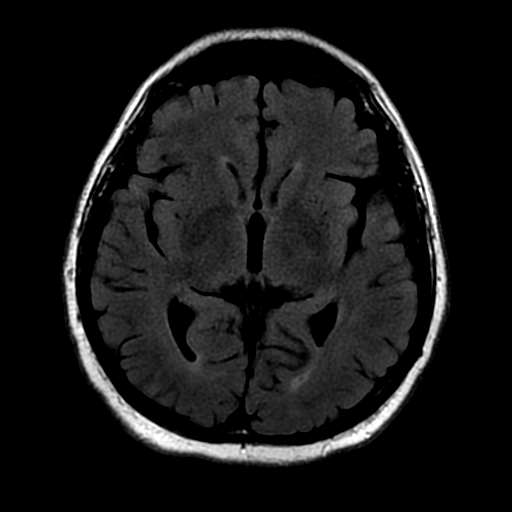
[im 24/24]
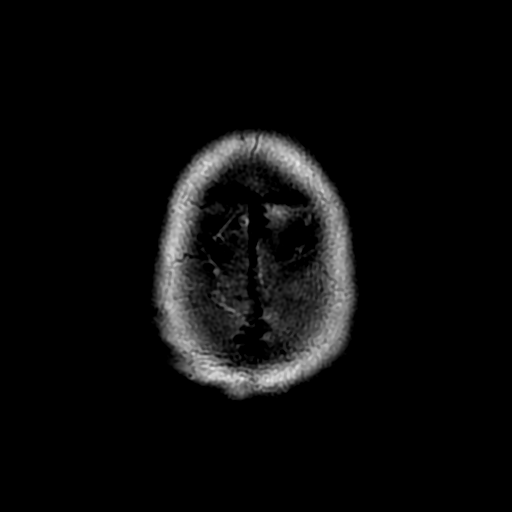

[Series 10: T2 · coronal · 5.0mm · 0.39mm/px · 3 of 25 slices shown (2 of 2)]
[im 1/25]
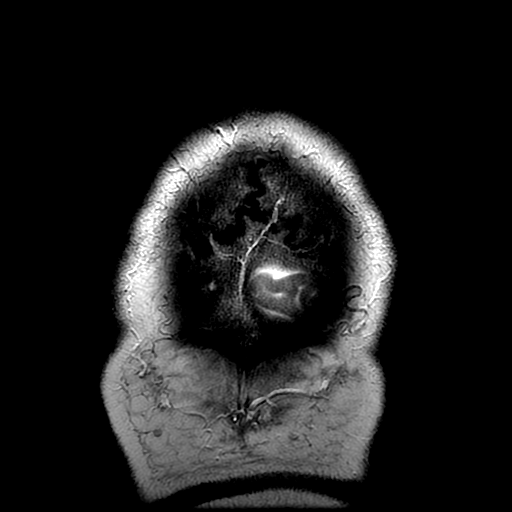
[im 13/25]
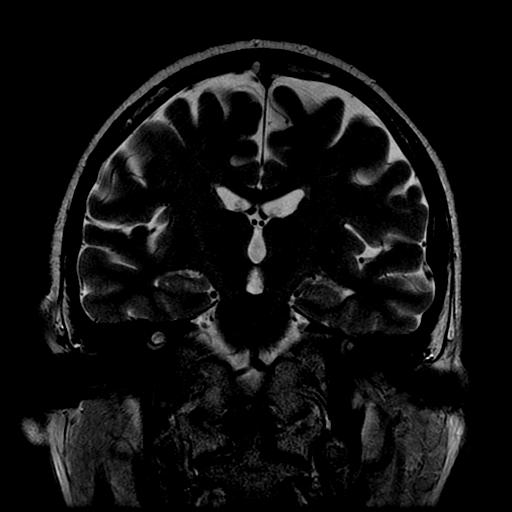
[im 25/25]
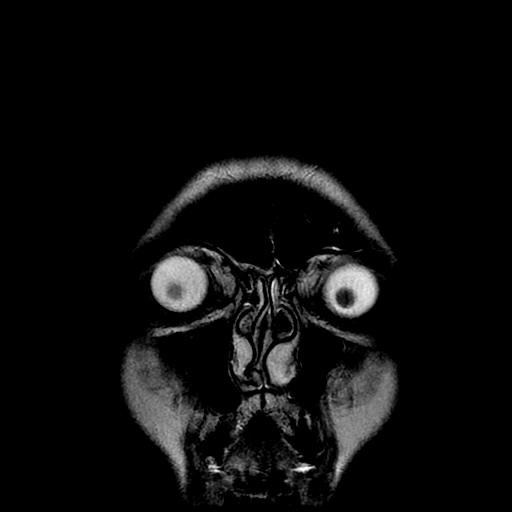

[Series 300: DWI · axial · 3.0mm · 1.09mm/px · z∈[-65,+67]mm · 5 of 47 slices shown (3 of 4)]
[im 1/47]
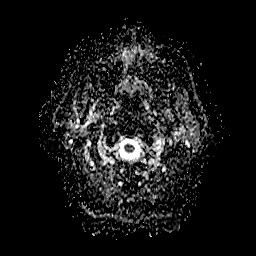
[im 12/47]
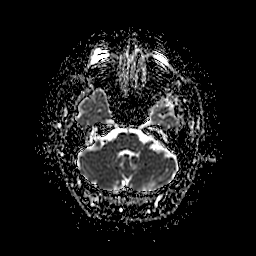
[im 24/47]
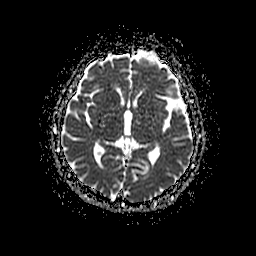
[im 35/47]
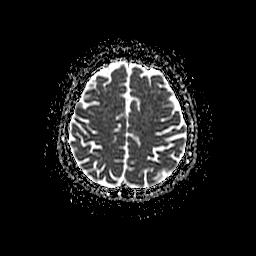
[im 47/47]
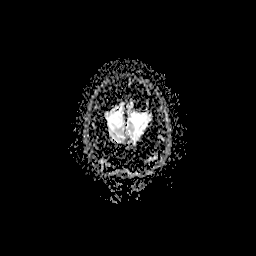

[Series 400: DWI · coronal · 5.0mm · 1.09mm/px · 3 of 33 slices shown (4 of 4)]
[im 1/33]
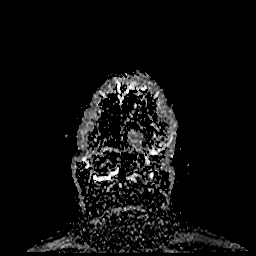
[im 17/33]
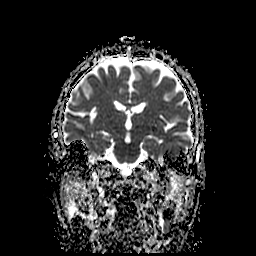
[im 33/33]
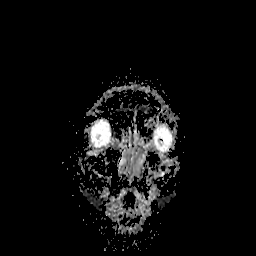

[35 of 48 positions shown; findings below may reference images not displayed]

FINDINGS: Brain: Right posterolateral frontal lobe reduced diffusion measuring
1.5 x 1.5 x 3.1 cm (volume = 3.7 cm^3) (AP x ML x CC series 3,
image 31 and series 4, image 19) compatible with acute/ early
subacute infarction. There are a few additional punctate foci of
reduced diffusion in the right anterolateral frontal lobe. The
infarct demonstrates mild T2 FLAIR hyperintense signal abnormality.
No significant mass effect. No evidence for intracranial hemorrhage,
hydrocephalus, or herniation.

Vascular: Normal flow voids.

Skull and upper cervical spine: Normal marrow signal.

Sinuses/Orbits: Negative.

Other: Well-circumscribed 8 mm T2 hyperintense, incomplete FLAIR
suppression, T1 hypointense focus within superior aspect of the
superficial lobe of left parotid gland with soft tissue attenuation
on prior CT angiogram (series 6, image 2).
IMPRESSION: 1. Right posterolateral frontal lobe acute/early subacute
infarction, volume 3.7 cc. No acute hemorrhage.
2. Otherwise unremarkable MRI of the brain.
3. 8 mm lesion within the superior aspect of superficial lobe of
left parotid gland with soft tissue attenuation on prior CT
angiogram. Differential includes salivary neoplasm and proteinaceous
cyst, unlikely lymph node given morphology. This area is likely
amenable to ultrasound evaluation to further characterize.

By: Rudi Jumper M.D.

## 2019-05-11 NOTE — Progress Notes (Signed)
Carelink Summary Report / Loop Recorder 

## 2019-06-03 LAB — CUP PACEART REMOTE DEVICE CHECK
Date Time Interrogation Session: 20200628103714
Implantable Pulse Generator Implant Date: 20180821

## 2019-06-04 ENCOUNTER — Ambulatory Visit (INDEPENDENT_AMBULATORY_CARE_PROVIDER_SITE_OTHER): Payer: BC Managed Care – PPO | Admitting: *Deleted

## 2019-06-04 DIAGNOSIS — I639 Cerebral infarction, unspecified: Secondary | ICD-10-CM | POA: Diagnosis not present

## 2019-06-16 NOTE — Progress Notes (Signed)
Carelink Summary Report / Loop Recorder 

## 2019-07-06 ENCOUNTER — Ambulatory Visit (INDEPENDENT_AMBULATORY_CARE_PROVIDER_SITE_OTHER): Payer: BC Managed Care – PPO | Admitting: *Deleted

## 2019-07-06 DIAGNOSIS — I639 Cerebral infarction, unspecified: Secondary | ICD-10-CM | POA: Diagnosis not present

## 2019-07-06 LAB — CUP PACEART REMOTE DEVICE CHECK
Date Time Interrogation Session: 20200731082944
Implantable Pulse Generator Implant Date: 20180821

## 2019-07-12 NOTE — Progress Notes (Signed)
Carelink Summary Report / Loop Recorder 

## 2019-07-30 DIAGNOSIS — F329 Major depressive disorder, single episode, unspecified: Secondary | ICD-10-CM | POA: Diagnosis not present

## 2019-07-30 DIAGNOSIS — E785 Hyperlipidemia, unspecified: Secondary | ICD-10-CM | POA: Diagnosis not present

## 2019-07-30 DIAGNOSIS — Z9181 History of falling: Secondary | ICD-10-CM | POA: Diagnosis not present

## 2019-07-30 DIAGNOSIS — Z1211 Encounter for screening for malignant neoplasm of colon: Secondary | ICD-10-CM | POA: Diagnosis not present

## 2019-08-08 ENCOUNTER — Ambulatory Visit (INDEPENDENT_AMBULATORY_CARE_PROVIDER_SITE_OTHER): Payer: BC Managed Care – PPO | Admitting: *Deleted

## 2019-08-08 DIAGNOSIS — I639 Cerebral infarction, unspecified: Secondary | ICD-10-CM | POA: Diagnosis not present

## 2019-08-09 LAB — CUP PACEART REMOTE DEVICE CHECK
Date Time Interrogation Session: 20200902113551
Implantable Pulse Generator Implant Date: 20180821

## 2019-08-23 NOTE — Progress Notes (Signed)
Carelink Summary Report / Loop Recorder 

## 2019-09-10 ENCOUNTER — Ambulatory Visit (INDEPENDENT_AMBULATORY_CARE_PROVIDER_SITE_OTHER): Payer: BC Managed Care – PPO | Admitting: *Deleted

## 2019-09-10 DIAGNOSIS — I639 Cerebral infarction, unspecified: Secondary | ICD-10-CM

## 2019-09-11 LAB — CUP PACEART REMOTE DEVICE CHECK
Date Time Interrogation Session: 20201005113636
Implantable Pulse Generator Implant Date: 20180821

## 2019-09-19 NOTE — Progress Notes (Signed)
Carelink Summary Report / Loop Recorder 

## 2019-10-02 ENCOUNTER — Encounter: Payer: Self-pay | Admitting: Family Medicine

## 2019-10-13 LAB — CUP PACEART REMOTE DEVICE CHECK
Date Time Interrogation Session: 20201107110529
Implantable Pulse Generator Implant Date: 20180821

## 2019-10-15 ENCOUNTER — Ambulatory Visit (INDEPENDENT_AMBULATORY_CARE_PROVIDER_SITE_OTHER): Payer: BC Managed Care – PPO | Admitting: *Deleted

## 2019-10-15 DIAGNOSIS — I639 Cerebral infarction, unspecified: Secondary | ICD-10-CM | POA: Diagnosis not present

## 2019-11-14 NOTE — Progress Notes (Signed)
Carelink Summary Report / Loop Recorder 

## 2019-11-15 ENCOUNTER — Ambulatory Visit (INDEPENDENT_AMBULATORY_CARE_PROVIDER_SITE_OTHER): Payer: BC Managed Care – PPO | Admitting: *Deleted

## 2019-11-15 DIAGNOSIS — I63411 Cerebral infarction due to embolism of right middle cerebral artery: Secondary | ICD-10-CM

## 2019-11-15 LAB — CUP PACEART REMOTE DEVICE CHECK
Date Time Interrogation Session: 20201210122810
Implantable Pulse Generator Implant Date: 20180821

## 2019-12-18 ENCOUNTER — Ambulatory Visit (INDEPENDENT_AMBULATORY_CARE_PROVIDER_SITE_OTHER): Payer: BC Managed Care – PPO | Admitting: *Deleted

## 2019-12-18 DIAGNOSIS — I63411 Cerebral infarction due to embolism of right middle cerebral artery: Secondary | ICD-10-CM | POA: Diagnosis not present

## 2019-12-18 LAB — CUP PACEART REMOTE DEVICE CHECK
Date Time Interrogation Session: 20210112123022
Implantable Pulse Generator Implant Date: 20180821

## 2019-12-18 NOTE — Progress Notes (Signed)
ILR remote 

## 2020-01-21 ENCOUNTER — Ambulatory Visit (INDEPENDENT_AMBULATORY_CARE_PROVIDER_SITE_OTHER): Payer: BC Managed Care – PPO | Admitting: *Deleted

## 2020-01-21 DIAGNOSIS — I63411 Cerebral infarction due to embolism of right middle cerebral artery: Secondary | ICD-10-CM | POA: Diagnosis not present

## 2020-01-21 LAB — CUP PACEART REMOTE DEVICE CHECK
Date Time Interrogation Session: 20210214231432
Implantable Pulse Generator Implant Date: 20180821

## 2020-01-22 NOTE — Progress Notes (Signed)
ILR Remote 

## 2020-02-18 ENCOUNTER — Ambulatory Visit (INDEPENDENT_AMBULATORY_CARE_PROVIDER_SITE_OTHER): Payer: BC Managed Care – PPO | Admitting: *Deleted

## 2020-02-18 DIAGNOSIS — I63411 Cerebral infarction due to embolism of right middle cerebral artery: Secondary | ICD-10-CM

## 2020-02-21 LAB — CUP PACEART REMOTE DEVICE CHECK
Date Time Interrogation Session: 20210318011631
Implantable Pulse Generator Implant Date: 20180821

## 2020-02-21 NOTE — Progress Notes (Signed)
ILR Remote 

## 2020-02-22 NOTE — Addendum Note (Signed)
Addended by: Geralyn Flash D on: 02/22/2020 02:47 PM   Modules accepted: Level of Service

## 2020-02-28 ENCOUNTER — Ambulatory Visit (INDEPENDENT_AMBULATORY_CARE_PROVIDER_SITE_OTHER): Payer: BC Managed Care – PPO | Admitting: *Deleted

## 2020-02-28 DIAGNOSIS — I639 Cerebral infarction, unspecified: Secondary | ICD-10-CM

## 2020-02-28 NOTE — Progress Notes (Signed)
ILR Remote 

## 2020-03-20 DIAGNOSIS — Z23 Encounter for immunization: Secondary | ICD-10-CM | POA: Diagnosis not present

## 2020-03-23 LAB — CUP PACEART REMOTE DEVICE CHECK
Date Time Interrogation Session: 20210418012601
Implantable Pulse Generator Implant Date: 20180821

## 2020-03-24 ENCOUNTER — Ambulatory Visit (INDEPENDENT_AMBULATORY_CARE_PROVIDER_SITE_OTHER): Payer: BC Managed Care – PPO | Admitting: *Deleted

## 2020-03-24 DIAGNOSIS — I639 Cerebral infarction, unspecified: Secondary | ICD-10-CM

## 2020-03-25 NOTE — Addendum Note (Signed)
Addended by: Geralyn Flash D on: 03/25/2020 09:21 AM   Modules accepted: Level of Service

## 2020-03-25 NOTE — Progress Notes (Signed)
ILR Remote 

## 2020-04-17 DIAGNOSIS — Z23 Encounter for immunization: Secondary | ICD-10-CM | POA: Diagnosis not present

## 2020-04-28 ENCOUNTER — Ambulatory Visit (INDEPENDENT_AMBULATORY_CARE_PROVIDER_SITE_OTHER): Payer: BC Managed Care – PPO | Admitting: *Deleted

## 2020-04-28 DIAGNOSIS — I639 Cerebral infarction, unspecified: Secondary | ICD-10-CM | POA: Diagnosis not present

## 2020-04-28 LAB — CUP PACEART REMOTE DEVICE CHECK
Date Time Interrogation Session: 20210523231519
Implantable Pulse Generator Implant Date: 20180821

## 2020-04-29 NOTE — Progress Notes (Signed)
Carelink Summary Report / Loop Recorder 

## 2020-06-02 ENCOUNTER — Ambulatory Visit (INDEPENDENT_AMBULATORY_CARE_PROVIDER_SITE_OTHER): Payer: BC Managed Care – PPO | Admitting: *Deleted

## 2020-06-02 DIAGNOSIS — I639 Cerebral infarction, unspecified: Secondary | ICD-10-CM | POA: Diagnosis not present

## 2020-06-02 LAB — CUP PACEART REMOTE DEVICE CHECK
Date Time Interrogation Session: 20210627231121
Implantable Pulse Generator Implant Date: 20180821

## 2020-06-03 NOTE — Progress Notes (Signed)
Carelink Summary Report / Loop Recorder 

## 2020-07-07 ENCOUNTER — Ambulatory Visit (INDEPENDENT_AMBULATORY_CARE_PROVIDER_SITE_OTHER): Payer: BC Managed Care – PPO | Admitting: *Deleted

## 2020-07-07 DIAGNOSIS — I639 Cerebral infarction, unspecified: Secondary | ICD-10-CM | POA: Diagnosis not present

## 2020-07-08 LAB — CUP PACEART REMOTE DEVICE CHECK
Date Time Interrogation Session: 20210730231433
Implantable Pulse Generator Implant Date: 20180821

## 2020-07-09 NOTE — Progress Notes (Signed)
Carelink Summary Report / Loop Recorder 

## 2020-08-07 ENCOUNTER — Ambulatory Visit (INDEPENDENT_AMBULATORY_CARE_PROVIDER_SITE_OTHER): Payer: BC Managed Care – PPO | Admitting: *Deleted

## 2020-08-07 DIAGNOSIS — I639 Cerebral infarction, unspecified: Secondary | ICD-10-CM | POA: Diagnosis not present

## 2020-08-07 LAB — CUP PACEART REMOTE DEVICE CHECK
Date Time Interrogation Session: 20210901231921
Implantable Pulse Generator Implant Date: 20180821

## 2020-08-12 NOTE — Progress Notes (Signed)
Carelink Summary Report / Loop Recorder 

## 2020-09-09 ENCOUNTER — Ambulatory Visit (INDEPENDENT_AMBULATORY_CARE_PROVIDER_SITE_OTHER): Payer: BC Managed Care – PPO

## 2020-09-09 DIAGNOSIS — I639 Cerebral infarction, unspecified: Secondary | ICD-10-CM

## 2020-09-09 LAB — CUP PACEART REMOTE DEVICE CHECK
Date Time Interrogation Session: 20211004232009
Implantable Pulse Generator Implant Date: 20180821

## 2020-09-12 NOTE — Progress Notes (Signed)
Carelink Summary Report / Loop Recorder 

## 2020-10-12 LAB — CUP PACEART REMOTE DEVICE CHECK
Date Time Interrogation Session: 20211106232048
Implantable Pulse Generator Implant Date: 20180821

## 2020-10-13 ENCOUNTER — Ambulatory Visit (INDEPENDENT_AMBULATORY_CARE_PROVIDER_SITE_OTHER): Payer: BC Managed Care – PPO

## 2020-10-13 DIAGNOSIS — I639 Cerebral infarction, unspecified: Secondary | ICD-10-CM

## 2020-10-13 NOTE — Progress Notes (Signed)
Carelink Summary Report / Loop Recorder 

## 2020-11-13 ENCOUNTER — Telehealth: Payer: Self-pay

## 2020-11-13 NOTE — Telephone Encounter (Signed)
[facility] ILR battery at [facility] as of 11/06/20.  Called to discuss options of explant or leave device in.  No answer, LMOVM.

## 2020-11-13 NOTE — Telephone Encounter (Signed)
I cancelled patient upcoming home remote appointments. Marked him inactive in [employer] and took him out of [employer].

## 2020-11-19 NOTE — Telephone Encounter (Signed)
LMOM to call DC , # and hours provided. Notify LINQ at [facility] .

## 2020-11-21 NOTE — Telephone Encounter (Signed)
LMOM to call DC  With #.

## 2020-11-24 NOTE — Telephone Encounter (Signed)
Patient made aware of ILR at [facility]. Return kit will be sent to patient. Patient does not want to have ILR extraction and will call if he changes his mind.

## 2021-03-10 DIAGNOSIS — Z125 Encounter for screening for malignant neoplasm of prostate: Secondary | ICD-10-CM | POA: Diagnosis not present

## 2021-03-10 DIAGNOSIS — E785 Hyperlipidemia, unspecified: Secondary | ICD-10-CM | POA: Diagnosis not present

## 2021-03-10 DIAGNOSIS — E559 Vitamin D deficiency, unspecified: Secondary | ICD-10-CM | POA: Diagnosis not present

## 2021-03-10 DIAGNOSIS — Z8673 Personal history of transient ischemic attack (TIA), and cerebral infarction without residual deficits: Secondary | ICD-10-CM | POA: Diagnosis not present

## 2021-03-10 DIAGNOSIS — Z1212 Encounter for screening for malignant neoplasm of rectum: Secondary | ICD-10-CM | POA: Diagnosis not present

## 2021-03-10 DIAGNOSIS — Z0000 Encounter for general adult medical examination without abnormal findings: Secondary | ICD-10-CM | POA: Diagnosis not present

## 2021-03-10 DIAGNOSIS — R001 Bradycardia, unspecified: Secondary | ICD-10-CM | POA: Diagnosis not present

## 2021-09-14 DIAGNOSIS — Z1211 Encounter for screening for malignant neoplasm of colon: Secondary | ICD-10-CM | POA: Diagnosis not present

## 2021-09-14 DIAGNOSIS — R7303 Prediabetes: Secondary | ICD-10-CM | POA: Diagnosis not present

## 2021-09-14 DIAGNOSIS — I69398 Other sequelae of cerebral infarction: Secondary | ICD-10-CM | POA: Diagnosis not present

## 2021-09-14 DIAGNOSIS — E785 Hyperlipidemia, unspecified: Secondary | ICD-10-CM | POA: Diagnosis not present

## 2021-09-14 DIAGNOSIS — Z9181 History of falling: Secondary | ICD-10-CM | POA: Diagnosis not present

## 2021-09-22 DIAGNOSIS — R1110 Vomiting, unspecified: Secondary | ICD-10-CM | POA: Diagnosis not present

## 2021-09-22 DIAGNOSIS — R1084 Generalized abdominal pain: Secondary | ICD-10-CM | POA: Diagnosis not present

## 2021-09-22 DIAGNOSIS — R1030 Lower abdominal pain, unspecified: Secondary | ICD-10-CM | POA: Diagnosis not present

## 2021-09-22 DIAGNOSIS — K5900 Constipation, unspecified: Secondary | ICD-10-CM | POA: Diagnosis not present

## 2021-09-22 DIAGNOSIS — R110 Nausea: Secondary | ICD-10-CM | POA: Diagnosis not present

## 2021-09-23 DIAGNOSIS — Z0000 Encounter for general adult medical examination without abnormal findings: Secondary | ICD-10-CM | POA: Diagnosis not present

## 2021-09-23 DIAGNOSIS — R1084 Generalized abdominal pain: Secondary | ICD-10-CM | POA: Diagnosis not present

## 2021-09-23 DIAGNOSIS — R1110 Vomiting, unspecified: Secondary | ICD-10-CM | POA: Diagnosis not present

## 2021-09-23 DIAGNOSIS — K5900 Constipation, unspecified: Secondary | ICD-10-CM | POA: Diagnosis not present

## 2021-09-23 DIAGNOSIS — K589 Irritable bowel syndrome without diarrhea: Secondary | ICD-10-CM | POA: Diagnosis not present

## 2021-09-28 DIAGNOSIS — R1030 Lower abdominal pain, unspecified: Secondary | ICD-10-CM | POA: Diagnosis not present

## 2021-10-09 DIAGNOSIS — Z20822 Contact with and (suspected) exposure to covid-19: Secondary | ICD-10-CM | POA: Diagnosis not present

## 2022-03-25 DIAGNOSIS — Z8673 Personal history of transient ischemic attack (TIA), and cerebral infarction without residual deficits: Secondary | ICD-10-CM | POA: Diagnosis not present

## 2022-03-25 DIAGNOSIS — I69398 Other sequelae of cerebral infarction: Secondary | ICD-10-CM | POA: Diagnosis not present

## 2022-03-25 DIAGNOSIS — F0631 Mood disorder due to known physiological condition with depressive features: Secondary | ICD-10-CM | POA: Diagnosis not present

## 2022-03-25 DIAGNOSIS — E785 Hyperlipidemia, unspecified: Secondary | ICD-10-CM | POA: Diagnosis not present

## 2022-03-25 DIAGNOSIS — E559 Vitamin D deficiency, unspecified: Secondary | ICD-10-CM | POA: Diagnosis not present

## 2022-03-25 DIAGNOSIS — R001 Bradycardia, unspecified: Secondary | ICD-10-CM | POA: Diagnosis not present

## 2022-03-25 DIAGNOSIS — Z1211 Encounter for screening for malignant neoplasm of colon: Secondary | ICD-10-CM | POA: Diagnosis not present

## 2022-03-25 DIAGNOSIS — R7303 Prediabetes: Secondary | ICD-10-CM | POA: Diagnosis not present

## 2022-03-25 DIAGNOSIS — Z1331 Encounter for screening for depression: Secondary | ICD-10-CM | POA: Diagnosis not present

## 2022-05-06 DIAGNOSIS — Z8673 Personal history of transient ischemic attack (TIA), and cerebral infarction without residual deficits: Secondary | ICD-10-CM | POA: Diagnosis not present

## 2022-05-06 DIAGNOSIS — I69398 Other sequelae of cerebral infarction: Secondary | ICD-10-CM | POA: Diagnosis not present

## 2022-05-06 DIAGNOSIS — F0631 Mood disorder due to known physiological condition with depressive features: Secondary | ICD-10-CM | POA: Diagnosis not present

## 2022-06-25 DIAGNOSIS — Z8673 Personal history of transient ischemic attack (TIA), and cerebral infarction without residual deficits: Secondary | ICD-10-CM | POA: Diagnosis not present

## 2022-06-25 DIAGNOSIS — F0631 Mood disorder due to known physiological condition with depressive features: Secondary | ICD-10-CM | POA: Diagnosis not present

## 2022-06-25 DIAGNOSIS — I69398 Other sequelae of cerebral infarction: Secondary | ICD-10-CM | POA: Diagnosis not present

## 2022-09-28 DIAGNOSIS — F0631 Mood disorder due to known physiological condition with depressive features: Secondary | ICD-10-CM | POA: Diagnosis not present

## 2022-09-28 DIAGNOSIS — Z8673 Personal history of transient ischemic attack (TIA), and cerebral infarction without residual deficits: Secondary | ICD-10-CM | POA: Diagnosis not present

## 2022-09-28 DIAGNOSIS — R001 Bradycardia, unspecified: Secondary | ICD-10-CM | POA: Diagnosis not present

## 2022-09-28 DIAGNOSIS — Z125 Encounter for screening for malignant neoplasm of prostate: Secondary | ICD-10-CM | POA: Diagnosis not present

## 2022-09-28 DIAGNOSIS — I69398 Other sequelae of cerebral infarction: Secondary | ICD-10-CM | POA: Diagnosis not present

## 2022-09-28 DIAGNOSIS — E559 Vitamin D deficiency, unspecified: Secondary | ICD-10-CM | POA: Diagnosis not present

## 2022-09-28 DIAGNOSIS — R7303 Prediabetes: Secondary | ICD-10-CM | POA: Diagnosis not present

## 2022-09-28 DIAGNOSIS — E785 Hyperlipidemia, unspecified: Secondary | ICD-10-CM | POA: Diagnosis not present

## 2022-09-28 DIAGNOSIS — Z139 Encounter for screening, unspecified: Secondary | ICD-10-CM | POA: Diagnosis not present

## 2022-09-28 DIAGNOSIS — Z9181 History of falling: Secondary | ICD-10-CM | POA: Diagnosis not present

## 2023-01-31 DIAGNOSIS — F0631 Mood disorder due to known physiological condition with depressive features: Secondary | ICD-10-CM | POA: Diagnosis not present

## 2023-01-31 DIAGNOSIS — E785 Hyperlipidemia, unspecified: Secondary | ICD-10-CM | POA: Diagnosis not present

## 2023-01-31 DIAGNOSIS — Z8673 Personal history of transient ischemic attack (TIA), and cerebral infarction without residual deficits: Secondary | ICD-10-CM | POA: Diagnosis not present

## 2023-01-31 DIAGNOSIS — E559 Vitamin D deficiency, unspecified: Secondary | ICD-10-CM | POA: Diagnosis not present

## 2023-01-31 DIAGNOSIS — F330 Major depressive disorder, recurrent, mild: Secondary | ICD-10-CM | POA: Diagnosis not present

## 2023-01-31 DIAGNOSIS — R7303 Prediabetes: Secondary | ICD-10-CM | POA: Diagnosis not present

## 2023-01-31 DIAGNOSIS — I69398 Other sequelae of cerebral infarction: Secondary | ICD-10-CM | POA: Diagnosis not present

## 2023-05-30 DIAGNOSIS — I69398 Other sequelae of cerebral infarction: Secondary | ICD-10-CM | POA: Diagnosis not present

## 2023-05-30 DIAGNOSIS — F330 Major depressive disorder, recurrent, mild: Secondary | ICD-10-CM | POA: Diagnosis not present

## 2023-05-30 DIAGNOSIS — E559 Vitamin D deficiency, unspecified: Secondary | ICD-10-CM | POA: Diagnosis not present

## 2023-05-30 DIAGNOSIS — E785 Hyperlipidemia, unspecified: Secondary | ICD-10-CM | POA: Diagnosis not present

## 2023-05-30 DIAGNOSIS — Z8673 Personal history of transient ischemic attack (TIA), and cerebral infarction without residual deficits: Secondary | ICD-10-CM | POA: Diagnosis not present

## 2023-05-30 DIAGNOSIS — R7303 Prediabetes: Secondary | ICD-10-CM | POA: Diagnosis not present

## 2023-05-30 DIAGNOSIS — F0631 Mood disorder due to known physiological condition with depressive features: Secondary | ICD-10-CM | POA: Diagnosis not present

## 2023-06-14 ENCOUNTER — Other Ambulatory Visit (HOSPITAL_COMMUNITY): Payer: Self-pay

## 2023-06-14 MED ORDER — DULOXETINE HCL 60 MG PO CPEP
60.0000 mg | ORAL_CAPSULE | Freq: Every day | ORAL | 1 refills | Status: AC
  Filled 2023-06-14: qty 90, 90d supply, fill #0

## 2023-06-20 ENCOUNTER — Other Ambulatory Visit (HOSPITAL_COMMUNITY): Payer: Self-pay

## 2023-06-22 ENCOUNTER — Other Ambulatory Visit (HOSPITAL_COMMUNITY): Payer: Self-pay

## 2023-09-12 DIAGNOSIS — Z2821 Immunization not carried out because of patient refusal: Secondary | ICD-10-CM | POA: Diagnosis not present

## 2023-09-12 DIAGNOSIS — Z8673 Personal history of transient ischemic attack (TIA), and cerebral infarction without residual deficits: Secondary | ICD-10-CM | POA: Diagnosis not present

## 2023-09-12 DIAGNOSIS — E559 Vitamin D deficiency, unspecified: Secondary | ICD-10-CM | POA: Diagnosis not present

## 2023-09-12 DIAGNOSIS — R7303 Prediabetes: Secondary | ICD-10-CM | POA: Diagnosis not present

## 2023-09-12 DIAGNOSIS — Z6828 Body mass index (BMI) 28.0-28.9, adult: Secondary | ICD-10-CM | POA: Diagnosis not present

## 2023-09-12 DIAGNOSIS — E785 Hyperlipidemia, unspecified: Secondary | ICD-10-CM | POA: Diagnosis not present

## 2023-12-13 DIAGNOSIS — I69398 Other sequelae of cerebral infarction: Secondary | ICD-10-CM | POA: Diagnosis not present

## 2023-12-13 DIAGNOSIS — R7303 Prediabetes: Secondary | ICD-10-CM | POA: Diagnosis not present

## 2023-12-13 DIAGNOSIS — R001 Bradycardia, unspecified: Secondary | ICD-10-CM | POA: Diagnosis not present

## 2023-12-13 DIAGNOSIS — E785 Hyperlipidemia, unspecified: Secondary | ICD-10-CM | POA: Diagnosis not present

## 2023-12-13 DIAGNOSIS — Z2821 Immunization not carried out because of patient refusal: Secondary | ICD-10-CM | POA: Diagnosis not present

## 2023-12-13 DIAGNOSIS — E559 Vitamin D deficiency, unspecified: Secondary | ICD-10-CM | POA: Diagnosis not present

## 2023-12-13 DIAGNOSIS — F330 Major depressive disorder, recurrent, mild: Secondary | ICD-10-CM | POA: Diagnosis not present

## 2023-12-13 DIAGNOSIS — Z9181 History of falling: Secondary | ICD-10-CM | POA: Diagnosis not present

## 2023-12-13 DIAGNOSIS — Z6827 Body mass index (BMI) 27.0-27.9, adult: Secondary | ICD-10-CM | POA: Diagnosis not present

## 2023-12-13 DIAGNOSIS — F0631 Mood disorder due to known physiological condition with depressive features: Secondary | ICD-10-CM | POA: Diagnosis not present

## 2023-12-13 DIAGNOSIS — Z0000 Encounter for general adult medical examination without abnormal findings: Secondary | ICD-10-CM | POA: Diagnosis not present

## 2024-04-16 DIAGNOSIS — Z8673 Personal history of transient ischemic attack (TIA), and cerebral infarction without residual deficits: Secondary | ICD-10-CM | POA: Diagnosis not present

## 2024-04-16 DIAGNOSIS — E559 Vitamin D deficiency, unspecified: Secondary | ICD-10-CM | POA: Diagnosis not present

## 2024-04-16 DIAGNOSIS — I69398 Other sequelae of cerebral infarction: Secondary | ICD-10-CM | POA: Diagnosis not present

## 2024-04-16 DIAGNOSIS — F330 Major depressive disorder, recurrent, mild: Secondary | ICD-10-CM | POA: Diagnosis not present

## 2024-04-16 DIAGNOSIS — R001 Bradycardia, unspecified: Secondary | ICD-10-CM | POA: Diagnosis not present

## 2024-04-16 DIAGNOSIS — E785 Hyperlipidemia, unspecified: Secondary | ICD-10-CM | POA: Diagnosis not present

## 2024-04-16 DIAGNOSIS — K429 Umbilical hernia without obstruction or gangrene: Secondary | ICD-10-CM | POA: Diagnosis not present

## 2024-04-16 DIAGNOSIS — Z6827 Body mass index (BMI) 27.0-27.9, adult: Secondary | ICD-10-CM | POA: Diagnosis not present

## 2024-04-16 DIAGNOSIS — R7303 Prediabetes: Secondary | ICD-10-CM | POA: Diagnosis not present

## 2024-04-16 DIAGNOSIS — F0631 Mood disorder due to known physiological condition with depressive features: Secondary | ICD-10-CM | POA: Diagnosis not present

## 2024-07-21 DIAGNOSIS — H6122 Impacted cerumen, left ear: Secondary | ICD-10-CM | POA: Diagnosis not present

## 2024-07-21 DIAGNOSIS — H81399 Other peripheral vertigo, unspecified ear: Secondary | ICD-10-CM | POA: Diagnosis not present

## 2024-08-20 DIAGNOSIS — R001 Bradycardia, unspecified: Secondary | ICD-10-CM | POA: Diagnosis not present

## 2024-08-20 DIAGNOSIS — E785 Hyperlipidemia, unspecified: Secondary | ICD-10-CM | POA: Diagnosis not present

## 2024-08-20 DIAGNOSIS — E559 Vitamin D deficiency, unspecified: Secondary | ICD-10-CM | POA: Diagnosis not present

## 2024-08-20 DIAGNOSIS — F0631 Mood disorder due to known physiological condition with depressive features: Secondary | ICD-10-CM | POA: Diagnosis not present

## 2024-08-20 DIAGNOSIS — F330 Major depressive disorder, recurrent, mild: Secondary | ICD-10-CM | POA: Diagnosis not present

## 2024-08-20 DIAGNOSIS — R7303 Prediabetes: Secondary | ICD-10-CM | POA: Diagnosis not present

## 2024-08-20 DIAGNOSIS — I69398 Other sequelae of cerebral infarction: Secondary | ICD-10-CM | POA: Diagnosis not present

## 2024-08-20 DIAGNOSIS — Z6827 Body mass index (BMI) 27.0-27.9, adult: Secondary | ICD-10-CM | POA: Diagnosis not present
